# Patient Record
Sex: Female | Born: 1975 | Race: White | Hispanic: No | Marital: Married | Smoking: Never smoker
Health system: Southern US, Community
[De-identification: ages and names within clinical notes are randomized; demographics above are authoritative.]

## PROBLEM LIST (undated history)

## (undated) DIAGNOSIS — Z9889 Other specified postprocedural states: Secondary | ICD-10-CM

## (undated) DIAGNOSIS — R519 Headache, unspecified: Secondary | ICD-10-CM

## (undated) DIAGNOSIS — I10 Essential (primary) hypertension: Secondary | ICD-10-CM

## (undated) DIAGNOSIS — R112 Nausea with vomiting, unspecified: Secondary | ICD-10-CM

---

## 2000-09-05 ENCOUNTER — Other Ambulatory Visit: Admission: RE | Admit: 2000-09-05 | Discharge: 2000-09-05 | Payer: Self-pay | Admitting: Obstetrics and Gynecology

## 2002-08-28 ENCOUNTER — Encounter: Admission: RE | Admit: 2002-08-28 | Discharge: 2002-08-28 | Payer: Self-pay | Admitting: Obstetrics and Gynecology

## 2002-08-28 ENCOUNTER — Encounter: Payer: Self-pay | Admitting: Obstetrics and Gynecology

## 2004-01-08 ENCOUNTER — Other Ambulatory Visit: Admission: RE | Admit: 2004-01-08 | Discharge: 2004-01-08 | Payer: Self-pay | Admitting: Obstetrics and Gynecology

## 2006-02-23 ENCOUNTER — Encounter: Admission: RE | Admit: 2006-02-23 | Discharge: 2006-02-23 | Payer: Self-pay | Admitting: Internal Medicine

## 2006-03-14 ENCOUNTER — Ambulatory Visit: Payer: Self-pay | Admitting: Cardiology

## 2006-03-23 ENCOUNTER — Ambulatory Visit: Payer: Self-pay | Admitting: Cardiology

## 2006-03-29 ENCOUNTER — Ambulatory Visit: Payer: Self-pay | Admitting: Cardiology

## 2006-08-21 HISTORY — PX: ABLATION: SHX5711

## 2007-02-06 ENCOUNTER — Encounter: Admission: RE | Admit: 2007-02-06 | Discharge: 2007-02-06 | Payer: Self-pay | Admitting: Family Medicine

## 2008-04-21 ENCOUNTER — Other Ambulatory Visit: Admission: RE | Admit: 2008-04-21 | Discharge: 2008-04-21 | Payer: Self-pay | Admitting: Obstetrics and Gynecology

## 2009-06-21 ENCOUNTER — Other Ambulatory Visit: Admission: RE | Admit: 2009-06-21 | Discharge: 2009-06-21 | Payer: Self-pay | Admitting: Obstetrics and Gynecology

## 2009-06-22 ENCOUNTER — Encounter: Admission: RE | Admit: 2009-06-22 | Discharge: 2009-06-22 | Payer: Self-pay | Admitting: Obstetrics and Gynecology

## 2009-10-16 ENCOUNTER — Emergency Department (HOSPITAL_COMMUNITY): Admission: EM | Admit: 2009-10-16 | Discharge: 2009-10-16 | Payer: Self-pay | Admitting: Emergency Medicine

## 2010-08-16 ENCOUNTER — Other Ambulatory Visit
Admission: RE | Admit: 2010-08-16 | Discharge: 2010-08-16 | Payer: Self-pay | Source: Home / Self Care | Admitting: Obstetrics and Gynecology

## 2010-09-11 ENCOUNTER — Encounter: Payer: Self-pay | Admitting: Neurosurgery

## 2010-09-23 ENCOUNTER — Other Ambulatory Visit: Payer: Self-pay | Admitting: Obstetrics and Gynecology

## 2010-09-23 DIAGNOSIS — Z139 Encounter for screening, unspecified: Secondary | ICD-10-CM

## 2010-09-23 DIAGNOSIS — M545 Low back pain: Secondary | ICD-10-CM

## 2011-01-06 NOTE — Assessment & Plan Note (Signed)
[facility]                            [facility]   NAME:[name], [name] KLINKER                         MRN:          [id number]  DATE:03/14/2006                            DOB:          1975/10/20    REASON FOR CONSULTATION:  [name] [name] is a very pleasant 35 year old female,  with no prior cardiac history, now referred for further evaluation of  persistent hypertension.   Patient describes recent history of pregnancy induced hypertension which  developed during her pregnancy in January of this year.  She underwent  subsequent C. section in February, giving birth to a baby boy.  She denies  any postpartum complications of congestive heart failure.   Since then, she reports having had several follow-up visits for OB follow-  up, but was unaware of any persistent hypertension.   On February 20, 2006, however, she presented to [facility] ER with complaint of  bilateral hand numbness and left facial tingling.  She was found to have  elevated blood pressure (144/98) with a pulse of 110 and was referred to her  primary care physician for further management.  She was initially placed on  Lopressor for blood pressure control, but this was then switched to Lotrel  for better control.   Patient has not had any recurrent bilateral hand numbness or facial  tingling.  She does, however, have recurrent headaches which she says  preceded her recent pregnancy.   Electrocardiogram today reveals normal sinus rhythm at 97 beats per minute  with normal axis and nonspecific ST changes.   Of note, the patient does report experiencing some transient tachy  palpitations approximately one and a half weeks ago while she was cleaning  her bathtub.  She noted some associated lightheadedness, but no  presyncope/syncope.  She had no associated chest discomfort or dyspnea.   ALLERGIES:  NO KNOWN DRUG ALLERGIES.   CURRENT MEDICATION:  Lotrel 5/20 mg daily.   PAST MEDICAL  HISTORY:  Status post C. section February 2007, complicated by  pregnancy induced hypertension.   SOCIAL HISTORY:  Patient is married, has a baby boy who will be five months  tomorrow.  She works for [employer] in [place], [place].  She denies tobacco, alcohol, or illicit drug use.   FAMILY HISTORY:  Both parents alive, age 63, the father with hypertension.  There is no known history of premature coronary artery disease.   REVIEW OF SYSTEMS:  Patient denies any history of myocardial infarction,  congestive heart failure, syncope, or stroke.  Denies any exertional angina,  exertional dyspnea, PND, orthopnea or lower extremity edema.  She also  denies any tachy palpitations.  Patient also denies any prior history of  hypertension or history of hyperlipidemia.  The remaining systems negative.   RECENT LABORATORY DATA:  D-dimer 151.  Sodium 138, potassium 3.4, BUN 8 and  creatinine 0.7, all on February 20, 2006.  TSH was 1.69.  Hemoglobin/hematocrit  and platelets were normal with mild leukocytosis (12.8).  Urinalysis was  negative.   Patient also reports having had a recent MRA of  the abdomen, which was  reportedly normal.   PHYSICAL EXAMINATION:  GENERAL APPEARANCE:  A 35 year old female, mildly  obese, in no apparent distress.  VITAL SIGNS:  Blood pressure 126/86 right, 120/78 left.  Pulse 97 and  regular, weight 187.  HEENT:  Normocephalic and atraumatic.  NECK:  Palpable carotid pulses without bruits.  No JVD.  LUNGS:  Clear to auscultation in all fields.  CARDIOVASCULAR:  Regular rate and rhythm (S1 and S2), positive S4.  No  significant murmur, no rub.  ABDOMEN:  Soft, nontender with intact bowel sounds.  EXTREMITIES:  Palpable distal pulses with trace pedal edema.  NEUROLOGIC:  No focal deficit.   IMPRESSION:  1.  Pregnancy induced hypertension.      1.  Improved on Lotrel.  2.  Status post sinus tachycardia.  3.  Status post cesarean section.      1.   February 2007.   PLAN:  1.  Schedule a 2-D echocardiogram  for assessment of left ventricular      function and rule out underlying structural abnormalities.  2.  A 24-hour urine study for assessment of catecholamines/metanephrines for      work-up of pheochromocytoma.  3.  Schedule patient to return to clinic for follow-up with Dr. Willa Rough      in 3-4 weeks - of note,      consideration may be given at that time to addition of low dose beta-      blocker if she exhibits persistent high basal heart rate and/or      hypertension.                                   Gene Serpe, PA-C                                Luis Abed, MD, [facility]   GS/MedQ  DD:  03/14/2006  DT:  03/14/2006  Job #:  [id number]   cc:   Kirstie Peri, MD  Doreen Beam

## 2011-01-06 NOTE — Assessment & Plan Note (Signed)
[facility]                            [facility]   NAME:Redondo, MAGALINE STEINBERG                         MRN:          [id number]  DATE:03/29/2006                            DOB:          [birth year]    See the complete note from the patient's initial visit dated March 14, 2006.   SUBJECTIVE:  Ms. Homen has postpregnancy hypertension.  She is doing very  well.  She has been on Lotrel, and now her pressure is down to 102/60.  She  is not having any significant symptoms.  A 2-D echo was done on March 23, 2006 and shows normal LV function with no significant abnormalities.  She  has completed a 24-hour urine for catecholamine, but it is still pending.  She is feeling fine.   At this point, she is doing well.  I have asked her to stop her  antihypertensive medicine, and have her pressure checked at her place of  work.  Unless her pressure rises above 140/85, I would like to try to keep  her off the medicine and see how she does.  I have reviewed information  concerning postpregnancy hypertension.  There is not a lot of information  concerning the parameters that might suggests whether she will not will not  need ongoing medicines over time.  However, I think it is worthwhile to see  how she does off the medicine at this time.  She is stable.  I will see her  back in a month.                                   Luis Abed, MD, [facility]   JDK/MedQ  DD:  03/29/2006  DT:  03/29/2006  Job #:  [id number]   cc:   Kirstie Peri, MD

## 2011-04-05 ENCOUNTER — Other Ambulatory Visit: Payer: Self-pay | Admitting: *Deleted

## 2011-04-05 ENCOUNTER — Other Ambulatory Visit: Payer: Self-pay | Admitting: Obstetrics and Gynecology

## 2011-04-05 DIAGNOSIS — S0190XA Unspecified open wound of unspecified part of head, initial encounter: Secondary | ICD-10-CM

## 2012-03-05 ENCOUNTER — Other Ambulatory Visit: Payer: Self-pay | Admitting: Obstetrics and Gynecology

## 2012-03-05 ENCOUNTER — Other Ambulatory Visit (HOSPITAL_COMMUNITY)
Admission: RE | Admit: 2012-03-05 | Discharge: 2012-03-05 | Disposition: A | Discharge status: Home / Self Care | Payer: PRIVATE HEALTH INSURANCE | Source: Ambulatory Visit | Attending: Obstetrics and Gynecology | Admitting: Obstetrics and Gynecology

## 2012-03-05 DIAGNOSIS — Z01419 Encounter for gynecological examination (general) (routine) without abnormal findings: Secondary | ICD-10-CM | POA: Insufficient documentation

## 2013-03-26 ENCOUNTER — Other Ambulatory Visit (HOSPITAL_COMMUNITY)
Admission: RE | Admit: 2013-03-26 | Discharge: 2013-03-26 | Disposition: A | Discharge status: Home / Self Care | Payer: PRIVATE HEALTH INSURANCE | Source: Ambulatory Visit | Attending: Obstetrics and Gynecology | Admitting: Obstetrics and Gynecology

## 2013-03-26 ENCOUNTER — Other Ambulatory Visit: Payer: Self-pay | Admitting: Obstetrics and Gynecology

## 2013-03-26 DIAGNOSIS — Z01419 Encounter for gynecological examination (general) (routine) without abnormal findings: Secondary | ICD-10-CM | POA: Insufficient documentation

## 2014-05-11 ENCOUNTER — Other Ambulatory Visit: Payer: Self-pay | Admitting: Obstetrics and Gynecology

## 2014-05-11 ENCOUNTER — Other Ambulatory Visit (HOSPITAL_COMMUNITY)
Admission: RE | Admit: 2014-05-11 | Discharge: 2014-05-11 | Disposition: A | Discharge status: Home / Self Care | Payer: PRIVATE HEALTH INSURANCE | Source: Ambulatory Visit | Attending: Obstetrics and Gynecology | Admitting: Obstetrics and Gynecology

## 2014-05-11 DIAGNOSIS — Z01419 Encounter for gynecological examination (general) (routine) without abnormal findings: Secondary | ICD-10-CM | POA: Insufficient documentation

## 2014-05-13 LAB — CYTOLOGY - PAP

## 2015-11-19 ENCOUNTER — Other Ambulatory Visit: Payer: Self-pay | Admitting: Obstetrics and Gynecology

## 2015-11-19 ENCOUNTER — Other Ambulatory Visit (HOSPITAL_COMMUNITY)
Admission: RE | Admit: 2015-11-19 | Discharge: 2015-11-19 | Disposition: A | Discharge status: Home / Self Care | Payer: PRIVATE HEALTH INSURANCE | Source: Ambulatory Visit | Attending: Obstetrics and Gynecology | Admitting: Obstetrics and Gynecology

## 2015-11-19 DIAGNOSIS — Z01419 Encounter for gynecological examination (general) (routine) without abnormal findings: Secondary | ICD-10-CM | POA: Insufficient documentation

## 2015-11-19 DIAGNOSIS — Z1151 Encounter for screening for human papillomavirus (HPV): Secondary | ICD-10-CM | POA: Diagnosis present

## 2015-11-22 LAB — CYTOLOGY - PAP

## 2018-03-20 ENCOUNTER — Ambulatory Visit
Admission: RE | Admit: 2018-03-20 | Discharge: 2018-03-20 | Disposition: A | Discharge status: Home / Self Care | Payer: PRIVATE HEALTH INSURANCE | Source: Ambulatory Visit | Attending: Family Medicine | Admitting: Family Medicine

## 2018-03-20 ENCOUNTER — Other Ambulatory Visit: Payer: Self-pay | Admitting: Family Medicine

## 2018-03-20 DIAGNOSIS — M79641 Pain in right hand: Secondary | ICD-10-CM

## 2018-08-22 ENCOUNTER — Other Ambulatory Visit: Payer: Self-pay | Admitting: Obstetrics and Gynecology

## 2018-08-22 DIAGNOSIS — Z1231 Encounter for screening mammogram for malignant neoplasm of breast: Secondary | ICD-10-CM

## 2018-08-23 ENCOUNTER — Ambulatory Visit
Admission: RE | Admit: 2018-08-23 | Discharge: 2018-08-23 | Disposition: A | Discharge status: Home / Self Care | Payer: PRIVATE HEALTH INSURANCE | Source: Ambulatory Visit | Attending: Obstetrics and Gynecology | Admitting: Obstetrics and Gynecology

## 2018-08-23 DIAGNOSIS — Z1231 Encounter for screening mammogram for malignant neoplasm of breast: Secondary | ICD-10-CM

## 2018-11-27 ENCOUNTER — Encounter: Payer: Self-pay | Admitting: Podiatry

## 2018-11-27 ENCOUNTER — Ambulatory Visit: Payer: PRIVATE HEALTH INSURANCE | Admitting: Podiatry

## 2018-11-27 ENCOUNTER — Other Ambulatory Visit: Payer: Self-pay

## 2018-11-27 VITALS — BP 102/75 | HR 92 | Temp 97.5°F | Resp 16

## 2018-11-27 DIAGNOSIS — B070 Plantar wart: Secondary | ICD-10-CM

## 2018-11-27 NOTE — Progress Notes (Signed)
   Subjective:    Patient ID: [name] [name], female    DOB: Apr 20, 1976, 43 y.o.   MRN: [id number]  HPI    Review of Systems  Allergic/Immunologic: Positive for environmental allergies.  All other systems reviewed and are negative.      Objective:   Physical Exam        Assessment & Plan:

## 2018-11-27 NOTE — Progress Notes (Signed)
Subjective:   Patient ID: [name] [name], female   DOB: 43 y.o.   MRN: [id number]   HPI Patient presents with a lesion underneath the right foot that is been very sore over the last few weeks and does not remember when it may have started.  States it is been going for a period of time and that it is hard for her to bear weight down on her foot and she does not member stepping on anything.  Patient does not smoke likes to be active   Review of Systems  All other systems reviewed and are negative.       Objective:  Physical Exam Vitals signs and nursing note reviewed.  Constitutional:      Appearance: She is well-developed.  Pulmonary:     Effort: Pulmonary effort is normal.  Musculoskeletal: Normal range of motion.  Skin:    General: Skin is warm.  Neurological:     Mental Status: She is alert.     Neurovascular status intact muscle strength is adequate range of motion within normal limits with patient found to have keratotic lesion fourth interspace right that is painful when pressed and makes walking difficult.  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  I did explain procedure risk and today I did a sterile prep of the area and I am then went ahead and excised the mass exposed tissue and applied phenol to the area and sterile dressing and I did send it off for pathology evaluation.  I instructed on soaks and dressing changes and will be seen back to recheck

## 2018-11-27 NOTE — Patient Instructions (Signed)

## 2019-02-18 ENCOUNTER — Other Ambulatory Visit (HOSPITAL_COMMUNITY)
Admission: RE | Admit: 2019-02-18 | Discharge: 2019-02-18 | Disposition: A | Discharge status: Home / Self Care | Payer: PRIVATE HEALTH INSURANCE | Source: Ambulatory Visit | Attending: Obstetrics and Gynecology | Admitting: Obstetrics and Gynecology

## 2019-02-18 ENCOUNTER — Other Ambulatory Visit: Payer: Self-pay | Admitting: Obstetrics and Gynecology

## 2019-02-18 DIAGNOSIS — Z01419 Encounter for gynecological examination (general) (routine) without abnormal findings: Secondary | ICD-10-CM | POA: Diagnosis not present

## 2019-02-19 LAB — CYTOLOGY - PAP
Diagnosis: NEGATIVE
HPV: NOT DETECTED

## 2019-08-29 ENCOUNTER — Other Ambulatory Visit: Payer: Self-pay | Admitting: Obstetrics and Gynecology

## 2019-08-29 DIAGNOSIS — Z1231 Encounter for screening mammogram for malignant neoplasm of breast: Secondary | ICD-10-CM

## 2019-09-02 ENCOUNTER — Ambulatory Visit
Admission: RE | Admit: 2019-09-02 | Discharge: 2019-09-02 | Disposition: A | Discharge status: Home / Self Care | Payer: Managed Care, Other (non HMO) | Source: Ambulatory Visit | Attending: Obstetrics and Gynecology | Admitting: Obstetrics and Gynecology

## 2019-09-02 ENCOUNTER — Other Ambulatory Visit: Payer: Self-pay

## 2019-09-02 DIAGNOSIS — Z1231 Encounter for screening mammogram for malignant neoplasm of breast: Secondary | ICD-10-CM

## 2020-07-23 ENCOUNTER — Other Ambulatory Visit: Payer: Self-pay | Admitting: Physician Assistant

## 2020-07-23 DIAGNOSIS — Z1231 Encounter for screening mammogram for malignant neoplasm of breast: Secondary | ICD-10-CM

## 2020-07-28 ENCOUNTER — Ambulatory Visit: Payer: PRIVATE HEALTH INSURANCE

## 2020-08-25 ENCOUNTER — Other Ambulatory Visit (HOSPITAL_COMMUNITY): Payer: Self-pay | Admitting: Internal Medicine

## 2020-08-25 ENCOUNTER — Ambulatory Visit: Payer: PRIVATE HEALTH INSURANCE | Attending: Internal Medicine

## 2020-08-25 DIAGNOSIS — Z23 Encounter for immunization: Secondary | ICD-10-CM

## 2020-08-25 NOTE — Progress Notes (Signed)
   Covid-19 Vaccination Clinic  Name:  [name] [name]    MRN: [id number] DOB: [birth year]  08/25/2020  [name] was observed post Covid-19 immunization for 15 minutes without incident. She was provided with Vaccine Information Sheet and instruction to access the V-Safe system.   [name] was instructed to call 911 with any severe reactions post vaccine: Marland Kitchen Difficulty breathing  . Swelling of face and throat  . A fast heartbeat  . A bad rash all over body  . Dizziness and weakness   Immunizations Administered    Name Date Dose VIS Date Route   Pfizer COVID-19 Vaccine 08/25/2020 12:31 PM 0.3 mL 06/09/2020 Intramuscular   Manufacturer: [employer], [employer]   Lot: [id number]   NDC: [address]-0

## 2020-09-02 ENCOUNTER — Other Ambulatory Visit: Payer: Self-pay

## 2020-09-02 ENCOUNTER — Other Ambulatory Visit: Payer: PRIVATE HEALTH INSURANCE

## 2020-09-02 DIAGNOSIS — Z20822 Contact with and (suspected) exposure to covid-19: Secondary | ICD-10-CM

## 2020-09-05 LAB — NOVEL CORONAVIRUS, NAA: SARS-CoV-2, NAA: NOT DETECTED

## 2020-10-06 ENCOUNTER — Other Ambulatory Visit: Payer: Self-pay | Admitting: Obstetrics and Gynecology

## 2020-10-06 DIAGNOSIS — Z1231 Encounter for screening mammogram for malignant neoplasm of breast: Secondary | ICD-10-CM

## 2020-10-29 ENCOUNTER — Ambulatory Visit
Admission: RE | Admit: 2020-10-29 | Discharge: 2020-10-29 | Disposition: A | Discharge status: Home / Self Care | Payer: Managed Care, Other (non HMO) | Source: Ambulatory Visit | Attending: Obstetrics and Gynecology | Admitting: Obstetrics and Gynecology

## 2020-10-29 ENCOUNTER — Other Ambulatory Visit: Payer: Self-pay

## 2020-10-29 DIAGNOSIS — Z1231 Encounter for screening mammogram for malignant neoplasm of breast: Secondary | ICD-10-CM

## 2021-01-20 ENCOUNTER — Other Ambulatory Visit (HOSPITAL_COMMUNITY): Payer: Self-pay

## 2021-08-23 ENCOUNTER — Ambulatory Visit (INDEPENDENT_AMBULATORY_CARE_PROVIDER_SITE_OTHER): Payer: Managed Care, Other (non HMO) | Admitting: Orthopedic Surgery

## 2021-08-23 ENCOUNTER — Other Ambulatory Visit: Payer: Self-pay

## 2021-08-23 DIAGNOSIS — M79601 Pain in right arm: Secondary | ICD-10-CM

## 2021-08-24 ENCOUNTER — Encounter: Payer: Self-pay | Admitting: Orthopedic Surgery

## 2021-08-24 ENCOUNTER — Ambulatory Visit
Admission: RE | Admit: 2021-08-24 | Discharge: 2021-08-24 | Disposition: A | Discharge status: Home / Self Care | Payer: Managed Care, Other (non HMO) | Source: Ambulatory Visit | Attending: Orthopedic Surgery | Admitting: Orthopedic Surgery

## 2021-08-24 DIAGNOSIS — M79601 Pain in right arm: Secondary | ICD-10-CM

## 2021-08-24 NOTE — Progress Notes (Signed)
Office Visit Note   Patient: [name] [name]           Date of Birth: [birth year]           MRN: [id number] Visit Date: 08/23/2021 Requested by: Lennie Odor, PA 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  This has been going on for several months.  Initially it started in her neck and now she localizes pain in the deltoid and biceps region.  Reports also numbness and tingling in the right hand which is a recent phenomenon.  She has had plain radiographs of the cervical spine which does show some mild early spurring.  She has been on a prednisone course x2 with the first course helping and the second course not helping.  Hurts her to lift her arm and hand.  The pain does radiate to the volar forearm at times.  Symptoms are better when she turns her head to the left.  The arm pain does wake her from sleep.  Otherwise Alyse Low is healthy with only hypertension as a medical problem.  She does spend a lot of time working in front of the computer in her job as an Scientist, physiological.              ROS: All systems reviewed are negative as they relate to the chief complaint within the history of present illness.  Patient denies  fevers or chills.   Assessment & Plan: Visit Diagnoses:  1. Right arm pain     Plan: Impression is right arm radiculopathy with normal right shoulder exam and radiographs and cervical spine radiographs which show some spurring.  With the numbness and tingling in her hand and the long duration of symptoms with failure of conservative management I think MRI scan is indicated for possible initiation of epidural steroid injections in the cervical spine.  Follow-up after that study.  Follow-Up Instructions: Return for after MRI.   Orders:  Orders Placed This Encounter   Procedures   MR Cervical Spine w/o contrast   No orders of the defined types were placed in this encounter.     Procedures: No procedures performed   Clinical Data: No additional findings.  Objective: Vital Signs: There were no vitals taken for this visit.  Physical Exam:   Constitutional: Patient appears well-developed HEENT:  Head: Normocephalic Eyes:EOM are normal Neck: Normal range of motion Cardiovascular: Normal rate Pulmonary/chest: Effort normal Neurologic: Patient is alert Skin: Skin is warm Psychiatric: Patient has normal mood and affect   Ortho Exam: Ortho exam demonstrates good cervical spine range of motion.  5 out of 5 grip EPL FPL interosseous wrist flexion extension bicep triceps and deltoid strength.  Reflexes symmetric 0 to 1+ out of 4 bilateral biceps and triceps.  No definite paresthesias C5-T1.  No subluxation of the ulnar nerve on either elbow.  Radial pulse intact bilaterally.  Shoulder examination on the right demonstrates full active and passive range of motion with no coarse grinding or crepitus.  Rotator cuff strength intact infraspinatus supraspinatus and subscap strength testing.  Specialty Comments:  No specialty comments available.  Imaging: No results found.   PMFS History: There are no problems to display for this patient.  History reviewed. No pertinent past medical history.  History  reviewed. No pertinent family history.  History reviewed. No pertinent surgical history. Social History   Occupational History   Not on file  Tobacco Use   Smoking status: Never   Smokeless tobacco: Never  Substance and Sexual Activity   Alcohol use: Not on file   Drug use: Not on file   Sexual activity: Not on file

## 2021-08-26 ENCOUNTER — Other Ambulatory Visit: Payer: Self-pay

## 2021-08-26 DIAGNOSIS — M79601 Pain in right arm: Secondary | ICD-10-CM

## 2021-08-26 NOTE — Progress Notes (Signed)
Hi Lauren.  I called Christy with the results.  I think that the MRI scan does show a bulge at C5-6.  She still is having whole right arm symptoms.  Talk to her and she is going to get cervical ESI at [facility].  Could you please send over a request for that for right arm radiculopathy.  If that does not help her she is can call back and we will get her set up for shoulder MRI and nerve study to evaluate carpal tunnel in the hand.  Thanks

## 2021-08-29 ENCOUNTER — Ambulatory Visit
Admission: RE | Admit: 2021-08-29 | Discharge: 2021-08-29 | Disposition: A | Discharge status: Home / Self Care | Payer: Managed Care, Other (non HMO) | Source: Ambulatory Visit | Attending: Orthopedic Surgery | Admitting: Orthopedic Surgery

## 2021-08-29 DIAGNOSIS — M79601 Pain in right arm: Secondary | ICD-10-CM

## 2021-08-29 MED ORDER — TRIAMCINOLONE ACETONIDE 40 MG/ML IJ SUSP (RADIOLOGY)
60.0000 mg | Freq: Once | INTRAMUSCULAR | Status: AC
  Administered 2021-08-29: 60 mg via EPIDURAL

## 2021-08-29 MED ORDER — IOPAMIDOL (ISOVUE-M 300) INJECTION 61%
1.0000 mL | Freq: Once | INTRAMUSCULAR | Status: AC
  Administered 2021-08-29: 1 mL via EPIDURAL

## 2021-08-29 NOTE — Discharge Instructions (Signed)

## 2021-08-31 ENCOUNTER — Other Ambulatory Visit: Payer: Managed Care, Other (non HMO)

## 2021-09-19 ENCOUNTER — Other Ambulatory Visit: Payer: Self-pay

## 2021-09-19 DIAGNOSIS — M79601 Pain in right arm: Secondary | ICD-10-CM

## 2021-09-29 ENCOUNTER — Ambulatory Visit
Admission: RE | Admit: 2021-09-29 | Discharge: 2021-09-29 | Disposition: A | Discharge status: Home / Self Care | Payer: Managed Care, Other (non HMO) | Source: Ambulatory Visit | Attending: Orthopedic Surgery | Admitting: Orthopedic Surgery

## 2021-09-29 DIAGNOSIS — M79601 Pain in right arm: Secondary | ICD-10-CM

## 2021-09-29 MED ORDER — TRIAMCINOLONE ACETONIDE 40 MG/ML IJ SUSP (RADIOLOGY)
60.0000 mg | Freq: Once | INTRAMUSCULAR | Status: AC
  Administered 2021-09-29: 60 mg via EPIDURAL

## 2021-09-29 MED ORDER — IOPAMIDOL (ISOVUE-M 300) INJECTION 61%
1.0000 mL | Freq: Once | INTRAMUSCULAR | Status: AC | PRN
  Administered 2021-09-29: 1 mL via EPIDURAL

## 2021-09-29 NOTE — Discharge Instructions (Signed)

## 2021-10-21 ENCOUNTER — Other Ambulatory Visit: Payer: Self-pay | Admitting: Physician Assistant

## 2021-10-21 DIAGNOSIS — Z1231 Encounter for screening mammogram for malignant neoplasm of breast: Secondary | ICD-10-CM

## 2021-11-10 ENCOUNTER — Ambulatory Visit
Admission: RE | Admit: 2021-11-10 | Discharge: 2021-11-10 | Disposition: A | Discharge status: Home / Self Care | Payer: Managed Care, Other (non HMO) | Source: Ambulatory Visit | Attending: Physician Assistant | Admitting: Physician Assistant

## 2021-11-10 ENCOUNTER — Other Ambulatory Visit: Payer: Self-pay

## 2021-11-10 DIAGNOSIS — Z1231 Encounter for screening mammogram for malignant neoplasm of breast: Secondary | ICD-10-CM

## 2021-11-17 ENCOUNTER — Encounter: Payer: Self-pay | Admitting: Orthopedic Surgery

## 2021-11-17 DIAGNOSIS — M79601 Pain in right arm: Secondary | ICD-10-CM

## 2021-11-18 ENCOUNTER — Ambulatory Visit
Admission: RE | Admit: 2021-11-18 | Discharge: 2021-11-18 | Disposition: A | Discharge status: Home / Self Care | Payer: Managed Care, Other (non HMO) | Source: Ambulatory Visit | Attending: Orthopedic Surgery | Admitting: Orthopedic Surgery

## 2021-11-18 DIAGNOSIS — M79601 Pain in right arm: Secondary | ICD-10-CM

## 2021-11-18 MED ORDER — IOPAMIDOL (ISOVUE-M 200) INJECTION 41%
13.0000 mL | Freq: Once | INTRAMUSCULAR | Status: AC
  Administered 2021-11-18: 13 mL via INTRA_ARTICULAR

## 2021-11-18 NOTE — Discharge Instructions (Signed)

## 2021-11-22 ENCOUNTER — Telehealth: Payer: Self-pay

## 2021-11-22 NOTE — Telephone Encounter (Signed)
Please advise on MRI results for patient. ?Thanks ? ?

## 2021-11-24 NOTE — Telephone Encounter (Signed)
I called all  - her and radiologist

## 2021-11-24 NOTE — Progress Notes (Signed)
I reviewed the MRI scan.  Called another radiologist.  She has some biceps at least partial tearing and possible haggle lesion.  Plan for surgery sometime in the near future.  I called [name] also to discuss those results.

## 2021-11-30 ENCOUNTER — Telehealth: Payer: Self-pay | Admitting: Orthopedic Surgery

## 2021-11-30 NOTE — Telephone Encounter (Incomplete Revision)
Please call [name] [name] to discuss surgery.  I have patient scheduled at [facility] on May 18th at 7:30am.  She would like to have the surgery sooner, rather than later.  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   Please call her cell  [phone].   ?

## 2021-11-30 NOTE — Telephone Encounter (Addendum)
 Please call Anjeanette to discuss surgery.  I have patient scheduled at [facility] on May 18th at 7:30am.  She would like to have the surgery sooner, rather than later.  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   Please call her cell  [phone].   ?

## 2021-12-01 NOTE — Telephone Encounter (Signed)
I called [name] [name].  She really has no dislocation symptoms.  When she fell did not really have much shoulder pain at that time.  Got good relief with 2 cervical spine injections but is continuing to have biceps pain.  Definitely at least partial tearing of the biceps tendon on MRI scan so the plan at this time is arthroscopy with debridement and biceps tenodesis.  I do not think that the inferior capsular issue is real problem.  I think she will be able to answer phones 4 days after surgery as well.

## 2021-12-09 ENCOUNTER — Telehealth: Payer: Self-pay

## 2021-12-09 NOTE — Telephone Encounter (Signed)
Patient set up for surgery on 01/05/22. [facility] denied the surgery stating it wasn't "medically necessary." Their rationale letter is on your desk. I went ahead and set a peer to peer for 12/21/21 @ 2:00. ?

## 2021-12-22 NOTE — Telephone Encounter (Signed)
thx

## 2021-12-28 ENCOUNTER — Other Ambulatory Visit (HOSPITAL_COMMUNITY): Payer: Managed Care, Other (non HMO)

## 2021-12-29 NOTE — Pre-Procedure Instructions (Signed)
Surgical Instructions ? ? ? Your procedure is scheduled on Thursday, May 18th. ? Report to [facility] Main Entrance "A" at 05:30 A.M., then check in with the Admitting office. ? Call this number if you have problems the morning of surgery: ? [phone] ? ? If you have any questions prior to your surgery date call [phone]: Open Monday-Friday 8am-4pm ? ? ? Remember: ? Do not eat after midnight the night before your surgery ? ?You may drink clear liquids until 04:30 AM the morning of your surgery.   ?Clear liquids allowed are: Water, Non-Citrus Juices (without pulp), Carbonated Beverages, Clear Tea, Black Coffee Only (NO MILK, CREAM OR POWDERED CREAMER of any kind), and Gatorade. ? ? ?Patient Instructions ? ?The night before surgery:  ?No food after midnight. ONLY clear liquids after midnight ? ?The day of surgery (if you do NOT have diabetes):  ?Drink ONE (1) Pre-Surgery Clear Ensure by 04:30 AM the morning of surgery. Drink in one sitting. Do not sip.  ?This drink was given to you during your hospital  ?pre-op appointment visit. ? ?Nothing else to drink after completing the  ?Pre-Surgery Clear Ensure. ? ? ?       If you have questions, please contact your surgeon?s office.  ? ?  ? Take these medicines the morning of surgery with A SIP OF WATER  ?amLODipine (NORVASC) ? ?As of today, STOP taking any Aspirin (unless otherwise instructed by your surgeon) Aleve, Naproxen, Ibuprofen, Motrin, Advil, Goody's, BC's, all herbal medications, fish oil, and all vitamins. ?         ?           ?Do NOT Smoke (Tobacco/Vaping) for 24 hours prior to your procedure. ? ?If you use a CPAP at night, you may bring your mask/headgear for your overnight stay. ?  ?Contacts, glasses, piercing's, hearing aid's, dentures or partials may not be worn into surgery, please bring cases for these belongings.  ?  ?For patients admitted to the hospital, discharge time will be determined by your treatment team. ?  ?Patients discharged the day of  surgery will not be allowed to drive home, and someone needs to stay with them for 24 hours. ? ?SURGICAL WAITING ROOM VISITATION ?Patients having surgery or a procedure may have two support people in the waiting room. These visitors may be switched out with other visitors if needed. ?Children under the age of 58 must have an adult accompany them who is not the patient. ?If the patient needs to stay at the hospital during part of their recovery, the visitor guidelines for inpatient rooms apply. ? ?Please refer to the [facility] website for the visitor guidelines for Inpatients (after your surgery is over and you are in a regular room).  ? ? ?Special instructions:   ?[facility]- Preparing For Surgery ? ?Before surgery, you can play an important role. Because skin is not sterile, your skin needs to be as free of germs as possible. You can reduce the number of germs on your skin by washing with CHG (chlorahexidine gluconate) Soap before surgery.  CHG is an antiseptic cleaner which kills germs and bonds with the skin to continue killing germs even after washing.   ? ?Oral Hygiene is also important to reduce your risk of infection.  Remember - BRUSH YOUR TEETH THE MORNING OF SURGERY WITH YOUR REGULAR TOOTHPASTE ? ?Please do not use if you have an allergy to CHG or antibacterial soaps. If your skin becomes reddened/irritated stop using the  CHG.  ?Do not shave (including legs and underarms) for at least 48 hours prior to first CHG shower. It is OK to shave your face. ? ?Please follow these instructions carefully. ?  ?Shower the NIGHT BEFORE SURGERY and the MORNING OF SURGERY ? ?If you chose to wash your hair, wash your hair first as usual with your normal shampoo. ? ?After you shampoo, rinse your hair and body thoroughly to remove the shampoo. ? ?Use CHG Soap as you would any other liquid soap. You can apply CHG directly to the skin and wash gently with a scrungie or a clean washcloth.  ? ?Apply the CHG Soap to your body  ONLY FROM THE NECK DOWN.  Do not use on open wounds or open sores. Avoid contact with your eyes, ears, mouth and genitals (private parts). Wash Face and genitals (private parts)  with your normal soap.  ? ?Wash thoroughly, paying special attention to the area where your surgery will be performed. ? ?Thoroughly rinse your body with warm water from the neck down. ? ?DO NOT shower/wash with your normal soap after using and rinsing off the CHG Soap. ? ?Pat yourself dry with a CLEAN TOWEL. ? ?Wear CLEAN PAJAMAS to bed the night before surgery ? ?Place CLEAN SHEETS on your bed the night before your surgery ? ?DO NOT SLEEP WITH PETS. ? ? ?Day of Surgery: ?Take a shower with CHG soap. ?Do not wear jewelry or makeup ?Do not wear lotions, powders, perfumes, or deodorant. ?Do not shave 48 hours prior to surgery.   ?Do not bring valuables to the hospital.  ?[facility] is not responsible for any belongings or valuables. ?Do not wear nail polish, gel polish, artificial nails, or any other type of covering on natural nails (fingers and toes) ?If you have artificial nails or gel coating that need to be removed by a nail salon, please have this removed prior to surgery. Artificial nails or gel coating may interfere with anesthesia's ability to adequately monitor your vital signs. ?Wear Clean/Comfortable clothing the morning of surgery ?Remember to brush your teeth WITH YOUR REGULAR TOOTHPASTE. ?  ?Please read over the following fact sheets that you were given. ? ? ? ?If you received a COVID test during your pre-op visit  it is requested that you wear a mask when out in public, stay away from anyone that may not be feeling well and notify your surgeon if you develop symptoms. If you have been in contact with anyone that has tested positive in the last 10 days please notify you surgeon.  ?

## 2021-12-30 ENCOUNTER — Encounter (HOSPITAL_COMMUNITY): Payer: Self-pay

## 2021-12-30 ENCOUNTER — Encounter (HOSPITAL_COMMUNITY)
Admission: RE | Admit: 2021-12-30 | Discharge: 2021-12-30 | Disposition: A | Discharge status: Home / Self Care | Payer: Managed Care, Other (non HMO) | Source: Ambulatory Visit | Attending: Orthopedic Surgery | Admitting: Orthopedic Surgery

## 2021-12-30 ENCOUNTER — Other Ambulatory Visit: Payer: Self-pay

## 2021-12-30 VITALS — BP 121/87 | HR 98 | Temp 98.6°F | Resp 17 | Ht 62.0 in | Wt 196.2 lb

## 2021-12-30 DIAGNOSIS — Z01812 Encounter for preprocedural laboratory examination: Secondary | ICD-10-CM | POA: Insufficient documentation

## 2021-12-30 DIAGNOSIS — Z01818 Encounter for other preprocedural examination: Secondary | ICD-10-CM

## 2021-12-30 HISTORY — DX: Essential (primary) hypertension: I10

## 2021-12-30 HISTORY — DX: Headache, unspecified: R51.9

## 2021-12-30 LAB — BASIC METABOLIC PANEL
Anion gap: 8 (ref 5–15)
BUN: 7 mg/dL (ref 6–20)
CO2: 26 mmol/L (ref 22–32)
Calcium: 9.1 mg/dL (ref 8.9–10.3)
Chloride: 102 mmol/L (ref 98–111)
Creatinine, Ser: 0.64 mg/dL (ref 0.44–1.00)
GFR, Estimated: >60 mL/min (ref >60)
Glucose, Bld: 87 mg/dL (ref 70–99)
Potassium: 3.1 mmol/L — ABNORMAL LOW (ref 3.5–5.1)
Sodium: 136 mmol/L (ref 135–145)

## 2021-12-30 LAB — CBC
HCT: 45.2 % (ref 36.0–46.0)
Hemoglobin: 15 g/dL (ref 12.0–15.0)
MCH: 29.9 pg (ref 26.0–34.0)
MCHC: 33.2 g/dL (ref 30.0–36.0)
MCV: 90 fL (ref 80.0–100.0)
Platelets: 414 10*3/uL — ABNORMAL HIGH (ref 150–400)
RBC: 5.02 MIL/uL (ref 3.87–5.11)
RDW: 13 % (ref 11.5–15.5)
WBC: 10 10*3/uL (ref 4.0–10.5)
nRBC: 0 % (ref 0.0–0.2)

## 2021-12-30 NOTE — Progress Notes (Signed)
PCP - Milus Height, PA ?Cardiologist - denies ? ?PPM/ICD - denies ? ? ?Chest x-ray - denies ?EKG - pt had one at [facility] 11/09/21- records requested ?Stress Test - denies ?ECHO - denies ?Cardiac Cath - denies ? ?Sleep Study - denies ? ? ?DM- denies ? ?ASA/Blood Thinner Instructions: n/a ? ? ?ERAS Protcol - yes ?PRE-SURGERY Ensure given at PAT ? ?COVID TEST- n/a ? ? ?Anesthesia review: no ? ?Patient denies shortness of breath, fever, cough and chest pain at PAT appointment ? ? ?All instructions explained to the patient, with a verbal understanding of the material. Patient agrees to go over the instructions while at home for a better understanding. Patient also instructed to notify surgeon of any contact with COVID+ person or if she develops any symptoms. The opportunity to ask questions was provided. ?  ?

## 2022-01-05 ENCOUNTER — Other Ambulatory Visit: Payer: Self-pay

## 2022-01-05 ENCOUNTER — Ambulatory Visit (HOSPITAL_BASED_OUTPATIENT_CLINIC_OR_DEPARTMENT_OTHER): Payer: Managed Care, Other (non HMO) | Admitting: Certified Registered"

## 2022-01-05 ENCOUNTER — Encounter (HOSPITAL_COMMUNITY)
Admission: RE | Disposition: A | Discharge status: Home / Self Care | Payer: Self-pay | Source: Home / Self Care | Attending: Orthopedic Surgery

## 2022-01-05 ENCOUNTER — Ambulatory Visit (HOSPITAL_COMMUNITY): Payer: Managed Care, Other (non HMO) | Admitting: Vascular Surgery

## 2022-01-05 ENCOUNTER — Encounter (HOSPITAL_COMMUNITY): Payer: Self-pay | Admitting: Orthopedic Surgery

## 2022-01-05 ENCOUNTER — Encounter: Payer: Self-pay | Admitting: Orthopedic Surgery

## 2022-01-05 ENCOUNTER — Ambulatory Visit (HOSPITAL_COMMUNITY)
Admission: RE | Admit: 2022-01-05 | Discharge: 2022-01-05 | Disposition: A | Discharge status: Home / Self Care | Payer: Managed Care, Other (non HMO) | Attending: Orthopedic Surgery | Admitting: Orthopedic Surgery

## 2022-01-05 DIAGNOSIS — Z79899 Other long term (current) drug therapy: Secondary | ICD-10-CM | POA: Diagnosis not present

## 2022-01-05 DIAGNOSIS — W19XXXA Unspecified fall, initial encounter: Secondary | ICD-10-CM | POA: Insufficient documentation

## 2022-01-05 DIAGNOSIS — M75121 Complete rotator cuff tear or rupture of right shoulder, not specified as traumatic: Secondary | ICD-10-CM | POA: Diagnosis not present

## 2022-01-05 DIAGNOSIS — S46011A Strain of muscle(s) and tendon(s) of the rotator cuff of right shoulder, initial encounter: Secondary | ICD-10-CM | POA: Diagnosis not present

## 2022-01-05 DIAGNOSIS — M75111 Incomplete rotator cuff tear or rupture of right shoulder, not specified as traumatic: Secondary | ICD-10-CM | POA: Diagnosis not present

## 2022-01-05 DIAGNOSIS — S43431A Superior glenoid labrum lesion of right shoulder, initial encounter: Secondary | ICD-10-CM

## 2022-01-05 DIAGNOSIS — M19011 Primary osteoarthritis, right shoulder: Secondary | ICD-10-CM | POA: Diagnosis present

## 2022-01-05 DIAGNOSIS — M7521 Bicipital tendinitis, right shoulder: Secondary | ICD-10-CM | POA: Diagnosis not present

## 2022-01-05 DIAGNOSIS — S46211A Strain of muscle, fascia and tendon of other parts of biceps, right arm, initial encounter: Secondary | ICD-10-CM

## 2022-01-05 DIAGNOSIS — I10 Essential (primary) hypertension: Secondary | ICD-10-CM | POA: Diagnosis not present

## 2022-01-05 DIAGNOSIS — Z01818 Encounter for other preprocedural examination: Secondary | ICD-10-CM

## 2022-01-05 HISTORY — DX: Other specified postprocedural states: Z98.890

## 2022-01-05 HISTORY — PX: SHOULDER ARTHROSCOPY WITH CAPSULORRHAPHY: SHX6454

## 2022-01-05 HISTORY — DX: Nausea with vomiting, unspecified: R11.2

## 2022-01-05 LAB — POCT PREGNANCY, URINE: Preg Test, Ur: NEGATIVE

## 2022-01-05 SURGERY — SHOULDER ATHROSCOPY WITH CAPSULORRHAPHY
Anesthesia: Regional | Site: Shoulder | Laterality: Right

## 2022-01-05 MED ORDER — CHLORHEXIDINE GLUCONATE 0.12 % MT SOLN: 15.0000 mL | Freq: Once | OROMUCOSAL | Status: AC

## 2022-01-05 MED ORDER — PROMETHAZINE HCL 25 MG/ML IJ SOLN
INTRAMUSCULAR | Status: AC
  Administered 2022-01-05: 6.25 mg via INTRAVENOUS
  Filled 2022-01-05: qty 1

## 2022-01-05 MED ORDER — FENTANYL CITRATE (PF) 250 MCG/5ML IJ SOLN
INTRAMUSCULAR | Status: AC
Start: 2022-01-05 — End: ?
  Filled 2022-01-05: qty 5

## 2022-01-05 MED ORDER — LIDOCAINE 2% (20 MG/ML) 5 ML SYRINGE
INTRAMUSCULAR | Status: DC | PRN
  Administered 2022-01-05: 20 mg via INTRAVENOUS

## 2022-01-05 MED ORDER — SODIUM CHLORIDE 0.9 % IR SOLN
Status: DC | PRN
  Administered 2022-01-05 (×2): 3000 mL

## 2022-01-05 MED ORDER — CEFAZOLIN SODIUM-DEXTROSE 2-4 GM/100ML-% IV SOLN
2.0000 g | INTRAVENOUS | Status: AC
  Administered 2022-01-05: 2 g via INTRAVENOUS

## 2022-01-05 MED ORDER — HYDROMORPHONE HCL 1 MG/ML IJ SOLN: 0.2500 mg | INTRAMUSCULAR | Status: DC | PRN

## 2022-01-05 MED ORDER — KETOROLAC TROMETHAMINE 30 MG/ML IJ SOLN
INTRAMUSCULAR | Status: AC
  Filled 2022-01-05: qty 1

## 2022-01-05 MED ORDER — OXYCODONE-ACETAMINOPHEN 5-325 MG PO TABS
1.0000 | ORAL_TABLET | ORAL | 0 refills | Status: DC | PRN
Start: 2022-01-05 — End: 2022-01-11

## 2022-01-05 MED ORDER — POVIDONE-IODINE 7.5 % EX SOLN
Freq: Once | CUTANEOUS | Status: DC
  Filled 2022-01-05: qty 118

## 2022-01-05 MED ORDER — AMISULPRIDE (ANTIEMETIC) 5 MG/2ML IV SOLN
INTRAVENOUS | Status: AC
  Filled 2022-01-05: qty 4

## 2022-01-05 MED ORDER — PHENYLEPHRINE HCL-NACL 20-0.9 MG/250ML-% IV SOLN
INTRAVENOUS | Status: DC | PRN
  Administered 2022-01-05: 40 ug/min via INTRAVENOUS

## 2022-01-05 MED ORDER — MIDAZOLAM HCL 2 MG/2ML IJ SOLN
INTRAMUSCULAR | Status: AC
  Filled 2022-01-05: qty 2

## 2022-01-05 MED ORDER — DEXAMETHASONE SODIUM PHOSPHATE 10 MG/ML IJ SOLN
INTRAMUSCULAR | Status: AC
  Filled 2022-01-05: qty 1

## 2022-01-05 MED ORDER — CEFAZOLIN SODIUM-DEXTROSE 2-4 GM/100ML-% IV SOLN
INTRAVENOUS | Status: AC
  Filled 2022-01-05: qty 100

## 2022-01-05 MED ORDER — PROPOFOL 10 MG/ML IV BOLUS
INTRAVENOUS | Status: DC | PRN
Start: 2022-01-05 — End: 2022-01-05
  Administered 2022-01-05: 200 mg via INTRAVENOUS

## 2022-01-05 MED ORDER — METHOCARBAMOL 500 MG PO TABS: 500.0000 mg | ORAL_TABLET | Freq: Three times a day (TID) | ORAL | 1 refills | Status: AC | PRN

## 2022-01-05 MED ORDER — POVIDONE-IODINE 10 % EX SWAB
2.0000 "application " | Freq: Once | CUTANEOUS | Status: AC
  Administered 2022-01-05: 2 via TOPICAL

## 2022-01-05 MED ORDER — ROPIVACAINE HCL 5 MG/ML IJ SOLN
INTRAMUSCULAR | Status: DC | PRN
  Administered 2022-01-05: 15 mL via PERINEURAL

## 2022-01-05 MED ORDER — AMISULPRIDE (ANTIEMETIC) 5 MG/2ML IV SOLN
10.0000 mg | Freq: Once | INTRAVENOUS | Status: AC | PRN
  Administered 2022-01-05: 10 mg via INTRAVENOUS

## 2022-01-05 MED ORDER — ALBUMIN HUMAN 5 % IV SOLN: 12.5000 g | Freq: Once | INTRAVENOUS | Status: AC

## 2022-01-05 MED ORDER — ORAL CARE MOUTH RINSE: 15.0000 mL | Freq: Once | OROMUCOSAL | Status: AC

## 2022-01-05 MED ORDER — OXYCODONE HCL 5 MG/5ML PO SOLN: 5.0000 mg | Freq: Once | ORAL | Status: DC | PRN

## 2022-01-05 MED ORDER — BUPIVACAINE HCL (PF) 0.25 % IJ SOLN
INTRAMUSCULAR | Status: AC
  Filled 2022-01-05: qty 30

## 2022-01-05 MED ORDER — OXYCODONE HCL 5 MG PO TABS: 5.0000 mg | ORAL_TABLET | Freq: Once | ORAL | Status: DC | PRN

## 2022-01-05 MED ORDER — SUGAMMADEX SODIUM 200 MG/2ML IV SOLN
INTRAVENOUS | Status: DC | PRN
  Administered 2022-01-05: 200 mg via INTRAVENOUS

## 2022-01-05 MED ORDER — KETOROLAC TROMETHAMINE 30 MG/ML IJ SOLN
30.0000 mg | Freq: Once | INTRAMUSCULAR | Status: AC | PRN
  Administered 2022-01-05: 30 mg via INTRAVENOUS

## 2022-01-05 MED ORDER — PROPOFOL 10 MG/ML IV BOLUS
INTRAVENOUS | Status: AC
  Filled 2022-01-05: qty 20

## 2022-01-05 MED ORDER — DEXAMETHASONE SODIUM PHOSPHATE 10 MG/ML IJ SOLN
INTRAMUSCULAR | Status: DC | PRN
  Administered 2022-01-05: 4 mg via INTRAVENOUS

## 2022-01-05 MED ORDER — SCOPOLAMINE 1 MG/3DAYS TD PT72
MEDICATED_PATCH | TRANSDERMAL | Status: AC
  Administered 2022-01-05: 1.5 mg via TRANSDERMAL
  Filled 2022-01-05: qty 1

## 2022-01-05 MED ORDER — LIDOCAINE 2% (20 MG/ML) 5 ML SYRINGE
INTRAMUSCULAR | Status: AC
  Filled 2022-01-05: qty 5

## 2022-01-05 MED ORDER — ONDANSETRON HCL 4 MG/2ML IJ SOLN
INTRAMUSCULAR | Status: AC
  Filled 2022-01-05: qty 2

## 2022-01-05 MED ORDER — EPINEPHRINE PF 1 MG/ML IJ SOLN
INTRAMUSCULAR | Status: DC | PRN
  Administered 2022-01-05 (×2): 1 mg

## 2022-01-05 MED ORDER — EPINEPHRINE PF 1 MG/ML IJ SOLN
INTRAMUSCULAR | Status: AC
  Filled 2022-01-05: qty 4

## 2022-01-05 MED ORDER — ONDANSETRON HCL 4 MG/2ML IJ SOLN: 4.0000 mg | Freq: Once | INTRAMUSCULAR | Status: DC | PRN

## 2022-01-05 MED ORDER — FENTANYL CITRATE (PF) 250 MCG/5ML IJ SOLN
INTRAMUSCULAR | Status: DC | PRN
  Administered 2022-01-05 (×3): 50 ug via INTRAVENOUS
  Administered 2022-01-05: 100 ug via INTRAVENOUS

## 2022-01-05 MED ORDER — LACTATED RINGERS IV SOLN: INTRAVENOUS | Status: DC

## 2022-01-05 MED ORDER — ALBUMIN HUMAN 5 % IV SOLN
12.5000 g | Freq: Once | INTRAVENOUS | Status: AC
Start: 2022-01-05 — End: 2022-01-05

## 2022-01-05 MED ORDER — CHLORHEXIDINE GLUCONATE 0.12 % MT SOLN
OROMUCOSAL | Status: AC
  Administered 2022-01-05: 15 mL via OROMUCOSAL
  Filled 2022-01-05: qty 15

## 2022-01-05 MED ORDER — SCOPOLAMINE 1 MG/3DAYS TD PT72: 1.0000 | MEDICATED_PATCH | TRANSDERMAL | Status: DC

## 2022-01-05 MED ORDER — CELECOXIB 100 MG PO CAPS: 100.0000 mg | ORAL_CAPSULE | Freq: Two times a day (BID) | ORAL | 0 refills | Status: AC

## 2022-01-05 MED ORDER — MIDAZOLAM HCL 2 MG/2ML IJ SOLN
INTRAMUSCULAR | Status: DC | PRN
Start: 2022-01-05 — End: 2022-01-05
  Administered 2022-01-05: 2 mg via INTRAVENOUS

## 2022-01-05 MED ORDER — ROCURONIUM BROMIDE 10 MG/ML (PF) SYRINGE
PREFILLED_SYRINGE | INTRAVENOUS | Status: AC
  Filled 2022-01-05: qty 10

## 2022-01-05 MED ORDER — ALBUMIN HUMAN 5 % IV SOLN
INTRAVENOUS | Status: AC
  Administered 2022-01-05: 12.5 g via INTRAVENOUS
  Filled 2022-01-05: qty 250

## 2022-01-05 MED ORDER — BUPIVACAINE LIPOSOME 1.3 % IJ SUSP
INTRAMUSCULAR | Status: DC | PRN
  Administered 2022-01-05: 10 mL via PERINEURAL

## 2022-01-05 MED ORDER — PHENYLEPHRINE 80 MCG/ML (10ML) SYRINGE FOR IV PUSH (FOR BLOOD PRESSURE SUPPORT)
PREFILLED_SYRINGE | INTRAVENOUS | Status: AC
  Filled 2022-01-05: qty 10

## 2022-01-05 MED ORDER — PROMETHAZINE HCL 25 MG/ML IJ SOLN: 6.2500 mg | INTRAMUSCULAR | Status: DC | PRN

## 2022-01-05 MED ORDER — ROCURONIUM BROMIDE 10 MG/ML (PF) SYRINGE
PREFILLED_SYRINGE | INTRAVENOUS | Status: DC | PRN
  Administered 2022-01-05: 60 mg via INTRAVENOUS

## 2022-01-05 MED ORDER — ONDANSETRON HCL 4 MG/2ML IJ SOLN
INTRAMUSCULAR | Status: DC | PRN
Start: 2022-01-05 — End: 2022-01-05
  Administered 2022-01-05: 4 mg via INTRAVENOUS

## 2022-01-05 SURGICAL SUPPLY — 82 items
AID PSTN UNV HD RSTRNT DISP (MISCELLANEOUS) ×1
ANCH SUT 2 FBRTK KNTLS 1.8 (Anchor) ×1 IMPLANT
ANCH SUT 2 SWLK 19.1 CLS EYLT (Anchor) ×2 IMPLANT
ANCH SUT FBRTK 1.3 2 TPE (Anchor) ×1 IMPLANT
ANCHOR FBRTK 2.6 SUTURETAP 1.3 (Anchor) ×1 IMPLANT
ANCHOR SUT 1.8 FIBERTAK SB KL (Anchor) ×1 IMPLANT
ANCHOR SWIVELOCK BIO 4.75X19.1 (Anchor) ×2 IMPLANT
BAG COUNTER SPONGE SURGICOUNT (BAG) ×2 IMPLANT
BAG SPNG CNTER NS LX DISP (BAG) ×1
BLADE EXCALIBUR 4.0X13 (MISCELLANEOUS) IMPLANT
BLADE SHAVER TORPEDO 4X13 (MISCELLANEOUS) ×2 IMPLANT
BLADE SURG 11 STRL SS (BLADE) ×2 IMPLANT
BUR OVAL 6.0 (BURR) IMPLANT
CANNULA 5.75X7 CRYSTAL CLEAR (CANNULA) ×1 IMPLANT
COOLER ICEMAN CLASSIC (MISCELLANEOUS) ×1 IMPLANT
COVER SURGICAL LIGHT HANDLE (MISCELLANEOUS) ×2 IMPLANT
DRAPE INCISE IOBAN 66X45 STRL (DRAPES) ×4 IMPLANT
DRAPE STERI 35X30 U-POUCH (DRAPES) ×2 IMPLANT
DRAPE U-SHAPE 47X51 STRL (DRAPES) ×4 IMPLANT
DRSG IV TEGADERM 3.5X4.5 STRL (GAUZE/BANDAGES/DRESSINGS) ×1 IMPLANT
DRSG PAD ABDOMINAL 8X10 ST (GAUZE/BANDAGES/DRESSINGS) ×6 IMPLANT
DRSG TEGADERM 4X4.5 CHG (GAUZE/BANDAGES/DRESSINGS) ×2 IMPLANT
DRSG TEGADERM 4X4.75 (GAUZE/BANDAGES/DRESSINGS) ×8 IMPLANT
DRSG XEROFORM 1X8 (GAUZE/BANDAGES/DRESSINGS) ×1 IMPLANT
DURAPREP 26ML APPLICATOR (WOUND CARE) ×2 IMPLANT
ELECT REM PT RETURN 9FT ADLT (ELECTROSURGICAL) ×2
ELECTRODE REM PT RTRN 9FT ADLT (ELECTROSURGICAL) ×1 IMPLANT
FILTER STRAW FLUID ASPIR (MISCELLANEOUS) ×2 IMPLANT
GAUZE SPONGE 4X4 12PLY STRL (GAUZE/BANDAGES/DRESSINGS) ×1 IMPLANT
GAUZE SPONGE 4X4 12PLY STRL LF (GAUZE/BANDAGES/DRESSINGS) ×2 IMPLANT
GAUZE XEROFORM 1X8 LF (GAUZE/BANDAGES/DRESSINGS) ×2 IMPLANT
GLOVE BIOGEL PI IND STRL 8 (GLOVE) ×1 IMPLANT
GLOVE BIOGEL PI INDICATOR 8 (GLOVE) ×1
GLOVE ECLIPSE 8.0 STRL XLNG CF (GLOVE) ×2 IMPLANT
GOWN STRL REUS W/ TWL LRG LVL3 (GOWN DISPOSABLE) ×3 IMPLANT
GOWN STRL REUS W/TWL LRG LVL3 (GOWN DISPOSABLE) ×6
KIT BASIN OR (CUSTOM PROCEDURE TRAY) ×2 IMPLANT
KIT TURNOVER KIT B (KITS) ×2 IMPLANT
MANIFOLD NEPTUNE II (INSTRUMENTS) ×2 IMPLANT
NDL HYPO 25X1 1.5 SAFETY (NEEDLE) ×1 IMPLANT
NDL SCORPION MULTI FIRE (NEEDLE) IMPLANT
NDL SPNL 18GX3.5 QUINCKE PK (NEEDLE) ×1 IMPLANT
NDL SUT 6 .5 CRC .975X.05 MAYO (NEEDLE) IMPLANT
NEEDLE HYPO 25X1 1.5 SAFETY (NEEDLE) ×2 IMPLANT
NEEDLE MAYO TAPER (NEEDLE)
NEEDLE SCORPION MULTI FIRE (NEEDLE) ×2 IMPLANT
NEEDLE SPNL 18GX3.5 QUINCKE PK (NEEDLE) ×2 IMPLANT
NS IRRIG 1000ML POUR BTL (IV SOLUTION) ×2 IMPLANT
PACK SHOULDER (CUSTOM PROCEDURE TRAY) ×2 IMPLANT
PAD ARMBOARD 7.5X6 YLW CONV (MISCELLANEOUS) ×4 IMPLANT
PAD COLD SHLDR WRAP-ON (PAD) ×1 IMPLANT
PORT APPOLLO RF 90DEGREE MULTI (SURGICAL WAND) IMPLANT
PROBE APOLLO 90XL (SURGICAL WAND) ×1 IMPLANT
RESTRAINT HEAD UNIVERSAL NS (MISCELLANEOUS) ×2 IMPLANT
SLING ARM IMMOBILIZER LRG (SOFTGOODS) ×1 IMPLANT
SLING ARM IMMOBILIZER MED (SOFTGOODS) IMPLANT
SPONGE T-LAP 4X18 ~~LOC~~+RFID (SPONGE) ×4 IMPLANT
STRIP CLOSURE SKIN 1/2X4 (GAUZE/BANDAGES/DRESSINGS) ×2 IMPLANT
SUCTION FRAZIER HANDLE 10FR (MISCELLANEOUS) ×2
SUCTION TUBE FRAZIER 10FR DISP (MISCELLANEOUS) ×1 IMPLANT
SUT ETHILON 3 0 PS 1 (SUTURE) ×2 IMPLANT
SUT FIBERWIRE #2 38 T-5 BLUE (SUTURE)
SUT FIBERWIRE 2-0 18 17.9 3/8 (SUTURE) ×4
SUT MNCRL AB 3-0 PS2 18 (SUTURE) ×2 IMPLANT
SUT VIC AB 0 CT1 27 (SUTURE) ×2
SUT VIC AB 0 CT1 27XBRD ANBCTR (SUTURE) ×1 IMPLANT
SUT VIC AB 1 CT1 27 (SUTURE) ×2
SUT VIC AB 1 CT1 27XBRD ANBCTR (SUTURE) ×1 IMPLANT
SUT VIC AB 2-0 CT1 27 (SUTURE) ×2
SUT VIC AB 2-0 CT1 TAPERPNT 27 (SUTURE) ×1 IMPLANT
SUT VICRYL 0 UR6 27IN ABS (SUTURE) IMPLANT
SUTURE FIBERWR #2 38 T-5 BLUE (SUTURE) IMPLANT
SUTURE FIBERWR 2-0 18 17.9 3/8 (SUTURE) IMPLANT
SYR 20ML LL LF (SYRINGE) ×4 IMPLANT
SYR 3ML LL SCALE MARK (SYRINGE) ×2 IMPLANT
SYR TB 1ML LUER SLIP (SYRINGE) ×2 IMPLANT
SYS FBRTK BUTTON 2.6 (Anchor) ×2 IMPLANT
SYSTEM FBRTK BUTTON 2.6 (Anchor) IMPLANT
TOWEL GREEN STERILE (TOWEL DISPOSABLE) ×2 IMPLANT
TOWEL GREEN STERILE FF (TOWEL DISPOSABLE) ×2 IMPLANT
TUBING ARTHROSCOPY IRRIG 16FT (MISCELLANEOUS) ×2 IMPLANT
WATER STERILE IRR 1000ML POUR (IV SOLUTION) ×2 IMPLANT

## 2022-01-05 NOTE — Anesthesia Preprocedure Evaluation (Addendum)
Anesthesia Evaluation  Patient identified by MRN, date of birth, ID band Patient awake    Reviewed: Allergy & Precautions, NPO status , Patient's Chart, lab work & pertinent test results  Airway Mallampati: III  TM Distance: >3 FB Neck ROM: Full    Dental  (+) Teeth Intact, Dental Advisory Given   Pulmonary neg pulmonary ROS,    Pulmonary exam normal breath sounds clear to auscultation       Cardiovascular hypertension, Pt. on medications Normal cardiovascular exam Rhythm:Regular Rate:Normal     Neuro/Psych  Headaches, negative psych ROS   GI/Hepatic negative GI ROS, Neg liver ROS,   Endo/Other  BMI 35  Renal/GU negative Renal ROS  negative genitourinary   Musculoskeletal negative musculoskeletal ROS (+)   Abdominal   Peds  Hematology negative hematology ROS (+)   Anesthesia Other Findings   Reproductive/Obstetrics negative OB ROS                            Anesthesia Physical Anesthesia Plan  ASA: 2  Anesthesia Plan: General and Regional   Post-op Pain Management: Regional block*   Induction: Intravenous  PONV Risk Score and Plan: 3 and Ondansetron, Dexamethasone, Midazolam and Treatment may vary due to age or medical condition  Airway Management Planned: Oral ETT  Additional Equipment: None  Intra-op Plan:   Post-operative Plan: Extubation in OR  Informed Consent: I have reviewed the patients History and Physical, chart, labs and discussed the procedure including the risks, benefits and alternatives for the proposed anesthesia with the patient or authorized representative who has indicated his/her understanding and acceptance.     Dental advisory given  Plan Discussed with: CRNA  Anesthesia Plan Comments:         Anesthesia Quick Evaluation

## 2022-01-05 NOTE — Transfer of Care (Signed)
Immediate Anesthesia Transfer of Care Note  Patient: [name] [name]  Procedure(s) Performed: right shoulder arthroscopy, biceps tenodesis, open capsular repair (Right: Shoulder)  Patient Location: PACU  Anesthesia Type:General and Regional  Level of Consciousness: drowsy, patient cooperative and responds to stimulation  Airway & Oxygen Therapy: Patient Spontanous Breathing and Patient connected to nasal cannula oxygen  Post-op Assessment: Report given to RN and Post -op Vital signs reviewed and stable  Post vital signs: Reviewed and stable  Last Vitals:  Vitals Value Taken Time  BP    Temp    Pulse 90 01/05/22 1042  Resp    SpO2 92 % 01/05/22 1042  Vitals shown include unvalidated device data.  Last Pain:  Vitals:   01/05/22 0549  TempSrc:   PainSc: 0-No pain         Complications: No notable events documented.

## 2022-01-05 NOTE — Op Note (Signed)
NAME: [name] [name], ZAKRZEWSKI MEDICAL RECORD NO: [id number] ACCOUNT NO: [id number] DATE OF BIRTH: May 25, 1976 FACILITY: [facility] LOCATION: [facility] PHYSICIAN: Graylin Shiver. August Saucer, MD  Operative Report   DATE OF PROCEDURE: 01/05/2022  PREOPERATIVE DIAGNOSIS:  Right shoulder SLAP tear with possible inferior capsular disruption.  POSTOPERATIVE DIAGNOSES:  Right shoulder degenerative superior labral degeneration and tearing with 90% thickness bursal-sided rotator cuff tear of the supraspinatus with underlying tuberosity spurring.  PROCEDURES:  Right shoulder arthroscopy with superior labral debridement, biceps tendon release, mini open biceps tenodesis and mini open repair, rotator cuff tear, supraspinatus bursal-sided 90% thickness tear measuring approximately 1.5 x 1.5 cm.  SURGEON:  Graylin Shiver. August Saucer, MD  ASSISTANT:  Karenann Cai, PA.  INDICATIONS:  The patient is a 46 year old patient with right shoulder pain of long duration.  Describes mechanical symptoms in the anterolateral aspect of the shoulder.  MRI scan did not show full thickness rotator cuff tear, but did show some labral  degeneration and biceps tendon partial tearing.  Also, questionable inferior capsular tearing, but there was no instability by history.  She presents now for operative management after failure of conservative management.  DESCRIPTION OF PROCEDURE:  The patient was brought to the operating room where general anesthetic was induced.  Preoperative antibiotics administered.  Timeout was called.  The patient was placed in the beach chair position with the head in neutral  position.  Both shoulders were examined preoperatively under anesthesia and she was found to have one and half posterior instability and 1+ anterior instability with less than a centimeter sulcus sign bilaterally.  The posterior laxity was definitely  more than the anterior laxity.  After positioning the right arm, shoulder and hand was pre-scrubbed with alcohol and  Betadine, allowed to air dry, prepped with DuraPrep solution and draped in sterile manner.  Collier Flowers was used to seal the operative field.   Timeout was called.  Posterior portal was created 2 cm medial and inferior to the posterolateral margin of acromion.  Anterior portal created under direct visualization.  The patient did have some degeneration around the biceps anchor.  Also, some  fraying of the labral tissue anteriorly.  This was debrided.  Biceps tendon did have also partial tearing when we pulled it into the joint.  Biceps tendon was released and we preserved the anterior labrum and superior labrum where possible.  Glenohumeral  articular surfaces were intact in particular, no Hill-Sachs or Bankart lesions were present.  Anterior inferior, posterior inferior glenohumeral ligaments were intact.  There was some thinning of the capsule inferiorly, but no discrete disruption.  The  rotator cuff also appeared intact from the articular side.  At this time, instruments were removed.  Portals were closed using 3-0 nylon.  The Collier Flowers was then used to cover the entire operative field.  Incision made off the anterolateral margin of the  acromion, raphae between the anterior middle deltoid was split and measured distance of 4 cm and marked with #1 Vicryl suture.  Bursectomy performed. The patient did have a near full-thickness bursal-sided tear of the supraspinatus, which was more  degenerative in nature.  Rasping of the anterolateral acromion was performed.  Biceps tendon was tenodesed into the bicipital groove using a knotless Arthrex SutureTak's 1.9 distally and 3.2 proximally.  Good fixation under appropriate tension was  achieved.  Next, attention was directed towards the rotator cuff tear.  The tear edges were debrided.  Using a 15 blade, we essentially got back to normal-appearing tissue, making the tear around  approximately 1 x 1 cm.  Once the degenerated bursal-sided  tendinous tissue was debrided using a  15 blade, a knotless suture anchor with 4 tapes was placed.  These 4 tapes were then placed through the tendon using a Scorpion suture passer.  Then, the margin convergence was performed with 2 inverted 2-0  FiberWire sutures, 1-0 Vicryl suture placed in the residual lateral tendinous tissue.  At this time, the suture tapes were tied and crossed.  They were then matched with the Vicryl suture, and FiberWire suture anteriorly and posteriorly.  These sutures  were then placed in SwiveLock for watertight repair achieved.  Arm was taken through range of motion, found to have no popping or clicking.  At this time, thorough irrigation was performed.  Deltoid split closed using #1 Vicryl suture followed by  interrupted inverted 0 Vicryl suture, 2-0 Vicryl suture, and 3-0 Monocryl with Steri-Strips and impervious dressings applied.  The patient tolerated the procedure well without immediate complications.  Luke's assistance was required at all times for  retraction, opening, closing, mobilization of tissue, suture management during the mini open rotator cuff tear repair and biceps tenodesis as well as drilling.  His assistance was a medical necessity.   PUS D: 01/05/2022 11:02:54 am T: 01/05/2022 1:40:00 pm  JOB: [id number]/ [id number]

## 2022-01-05 NOTE — Anesthesia Procedure Notes (Signed)
Anesthesia Regional Block: Interscalene brachial plexus block   Pre-Anesthetic Checklist: , timeout performed,  Correct Patient, Correct Site, Correct Laterality,  Correct Procedure, Correct Position, site marked,  Risks and benefits discussed,  Surgical consent,  Pre-op evaluation,  At surgeon's request and post-op pain management  Laterality: Right  Prep: Maximum Sterile Barrier Precautions used, chloraprep       Needles:  Injection technique: Single-shot  Needle Type: Echogenic Stimulator Needle     Needle Length: 9cm  Needle Gauge: 22     Additional Needles:   Procedures:,,,, ultrasound used (permanent image in chart),,    Narrative:  Start time: 01/05/2022 7:00 AM End time: 01/05/2022 7:10 AM Injection made incrementally with aspirations every 5 mL.  Performed by: Personally  Anesthesiologist: Pervis Hocking, DO  Additional Notes: Monitors applied. No increased pain on injection. No increased resistance to injection. Injection made in 5cc increments. Good needle visualization. Patient tolerated procedure well.

## 2022-01-05 NOTE — Brief Op Note (Signed)
   01/05/2022  10:55 AM  PATIENT:  [name] [name]  46 y.o. female  PRE-OPERATIVE DIAGNOSIS:  right shoulder biceps tear,capsular tear  POST-OPERATIVE DIAGNOSIS:  right shoulder biceps tear, near full-thickness bursal rotator cuff tear supraspinatus r  PROCEDURE:  Procedure(s): right shoulder arthroscopy, biceps tenodesis, mini open rotator cuff tear repair  SURGEON:  Surgeon(s): Marlou Sa, Tonna Corner, MD  ASSISTANT: Annie Main, PA  ANESTHESIA:   general  EBL: 15 ml    Total I/O In: 750 [I.V.:750] Out: 50 [Blood:50]  BLOOD ADMINISTERED: none  DRAINS: none   LOCAL MEDICATIONS USED:  none  SPECIMEN:  No Specimen  COUNTS:  YES  TOURNIQUET:  * No tourniquets in log *  DICTATION: .[id number] PLAN OF CARE: Discharge to home after PACU  PATIENT DISPOSITION:  PACU - hemodynamically stable  DIAGNOSES: Right shoulder, chronic rotator cuff tear and biceps tendinitis.  POST-OPERATIVE DIAGNOSIS: same  PROCEDURE: Open repair chronic rotator cuff tear - [id number] Open subpectoral biceps tenodesis - [id number] Arthroscopic limited debridement [id number]   OPERATIVE FINDING: Exam under anesthesia:  1-1/2+ posterior instability bilaterally with 1+ anterior instability and less than a centimeter sulcus sign bilaterally Articular space: Normal Chondral surfaces: Normal Biceps: degenerative SLAP tear Subscapularis: Intact  Supraspinatus: Incomplete tear 1 x 1 cm bursal sided Infraspinatus: Intact

## 2022-01-05 NOTE — H&P (Signed)
[name] [name] is an 46 y.o. female.   Chief Complaint: Right shoulder pain HPI: Patient presents with right shoulder pain of at least 6 months duration.  Describes having a fall about 6 months ago.  Developed shoulder and arm pain which initially appeared to be more radicular in nature.  Patient tried Medrol Dosepak x2 but had persistent symptoms radiating into the volar aspect of her arm.  MRI scanning in January of the neck demonstrated some loss of lordosis and mild degenerative changes but no discrete herniated disc.  She did have an injection in her neck which helped with the radicular component of her arm pain but the shoulder symptoms persisted.  Subsequent MRI scan several months later demonstrated partial tearing of the biceps with possible degenerative tearing of the superior labrum with intact rotator cuff.  She does report more mechanical symptoms in the shoulder localizing prominently anteriorly.  She has failed conservative management with the shoulder.  Presents now for operative evaluation and arthroscopy with possible biceps tenodesis and superior labral debridement.  Past Medical History:  Diagnosis Date   Headache    pt has migraines (she states maybe about 10 times a month)   Hypertension     Past Surgical History:  Procedure Laterality Date   ABLATION  2008   endometrial   CESAREAN SECTION  2007    Family History  Problem Relation Age of Onset   Breast cancer Neg Hx    Social History:  reports that she has never smoked. She has never used smokeless tobacco. She reports that she does not currently use alcohol. She reports that she does not use drugs.  Allergies: No Known Allergies  Medications Prior to Admission  Medication Sig Dispense Refill   amLODipine (NORVASC) 5 MG tablet Take 5 mg by mouth daily.     hydrochlorothiazide (HYDRODIURIL) 25 MG tablet Take 12.5 mg by mouth daily.     Multiple Vitamins-Minerals (MULTIVITAMIN WITH MINERALS) tablet Take 1 tablet by  mouth daily.     valsartan (DIOVAN) 160 MG tablet Take 160 mg by mouth daily.      Results for orders placed or performed during the hospital encounter of 01/05/22 (from the past 48 hour(s))  Pregnancy, urine POC     Status: None   Collection Time: 01/05/22  5:57 AM  Result Value Ref Range   Preg Test, Ur NEGATIVE NEGATIVE    Comment:        THE SENSITIVITY OF THIS METHODOLOGY IS >24 mIU/mL    No results found.  Review of Systems  Musculoskeletal:  Positive for arthralgias.  All other systems reviewed and are negative.  Blood pressure 133/81, pulse 97, temperature 97.8 F (36.6 C), temperature source Oral, resp. rate 18, height 5\' 2"  (1.575 m), weight 86.2 kg, SpO2 100 %. Physical Exam Vitals reviewed.  HENT:     Head: Normocephalic.     Nose: Nose normal.  Eyes:     Pupils: Pupils are equal, round, and reactive to light.  Cardiovascular:     Rate and Rhythm: Normal rate.     Pulses: Normal pulses.  Pulmonary:     Effort: Pulmonary effort is normal.  Abdominal:     General: Abdomen is flat.  Musculoskeletal:     Cervical back: Normal range of motion.  Skin:    General: Skin is warm.     Capillary Refill: Capillary refill takes less than 2 seconds.  Neurological:     General: No focal deficit present.  Mental Status: She is alert.  Ortho exam demonstrates good range of motion of the neck and right shoulder.  O'Brien's testing equivocal on the right negative on the left.  Negative apprehension relocation testing on the right and left-hand side.  No discrete AC joint tenderness is present.  Patient does localize pain in the anterior aspect of the shoulder.  No restriction of external rotation asymmetrically right versus left.  Rotator cuff strength intact infraspinatus supraspinatus and subscap muscle testing.  Assessment/Plan Impression is right shoulder pain following a fall earlier this year.  Initially patient describes symptoms consistent with possible  radiculopathy.  Cervical spine MRI was obtained which showed no significant foraminal narrowing.  The radicular component of her symptoms improved with epidural steroid injection in the neck; however her shoulder symptoms persisted and have become more mechanical in nature.  MRI scanning of the shoulder does demonstrate at least partial tearing of the biceps tendon.  Degenerative SLAP tear may also be present which is consistent with her mechanism of injury with the fall approximately 6 months ago.  With failure of conservative treatment the plan at this time is shoulder arthroscopy with evaluation of the biceps tendon and biceps anchor.  We will also look at the inferior capsule which had an abnormality on MRI scanning which may or may not be related to contrast extravasation.  She does not really report any instability symptoms but more mechanical symptoms likely related to the biceps tendon.  The risk and benefits of the procedure are discussed including but not limited to infection nerve vessel damage shoulder stiffness as well as incomplete pain relief.  Patient understands risk and benefits and wishes to proceed.  All questions answered.  Burnard Bunting, MD 01/05/2022, 6:57 AM

## 2022-01-05 NOTE — Anesthesia Postprocedure Evaluation (Signed)
Anesthesia Post Note  Patient: [name] [name]  Procedure(s) Performed: right shoulder arthroscopy, biceps tenodesis, open capsular repair (Right: Shoulder)     Patient location during evaluation: PACU Anesthesia Type: Regional and General Level of consciousness: awake and alert, oriented and patient cooperative Pain management: pain level controlled Vital Signs Assessment: post-procedure vital signs reviewed and stable Respiratory status: spontaneous breathing, nonlabored ventilation and respiratory function stable Cardiovascular status: blood pressure returned to baseline and stable Postop Assessment: no apparent nausea or vomiting Anesthetic complications: yes Comments: PONV in PACU resistant to barhemysis   No notable events documented.  Last Vitals:  Vitals:   01/05/22 1200 01/05/22 1215  BP: (!) 84/48 (!) 90/49  Pulse: 95 93  Resp: 13 16  Temp:    SpO2: 94% 94%    Last Pain:  Vitals:   01/05/22 1115  TempSrc:   PainSc: 0-No pain                 Lannie Fields

## 2022-01-05 NOTE — Anesthesia Procedure Notes (Signed)
Procedure Name: Intubation Date/Time: 01/05/2022 7:35 AM Performed by: Cathren Harsh, CRNA Pre-anesthesia Checklist: Patient identified, Emergency Drugs available, Suction available and Patient being monitored Patient Re-evaluated:Patient Re-evaluated prior to induction Oxygen Delivery Method: Circle System Utilized Preoxygenation: Pre-oxygenation with 100% oxygen Induction Type: IV induction Ventilation: Mask ventilation without difficulty Laryngoscope Size: Mac and 3 Grade View: Grade II Tube type: Oral Tube size: 7.0 mm Number of attempts: 1 Airway Equipment and Method: Stylet and Oral airway Placement Confirmation: ETT inserted through vocal cords under direct vision, positive ETCO2 and breath sounds checked- equal and bilateral Secured at: 21 cm Tube secured with: Tape Dental Injury: Teeth and Oropharynx as per pre-operative assessment

## 2022-01-06 ENCOUNTER — Other Ambulatory Visit: Payer: Self-pay | Admitting: Surgical

## 2022-01-06 ENCOUNTER — Encounter (HOSPITAL_COMMUNITY): Payer: Self-pay | Admitting: Orthopedic Surgery

## 2022-01-06 MED ORDER — ONDANSETRON HCL 4 MG PO TABS: 4.0000 mg | ORAL_TABLET | Freq: Three times a day (TID) | ORAL | 0 refills | Status: AC | PRN

## 2022-01-07 ENCOUNTER — Telehealth: Payer: Self-pay | Admitting: Orthopedic Surgery

## 2022-01-07 NOTE — Telephone Encounter (Signed)
Just talk with [name] [name].  In general she is feeling better than she did immediately after surgery.  She is doing some pendulum exercises and will get the CPM machine on Monday.

## 2022-01-11 ENCOUNTER — Other Ambulatory Visit: Payer: Self-pay | Admitting: Orthopedic Surgery

## 2022-01-11 MED ORDER — OXYCODONE HCL 5 MG PO TABS: 5.0000 mg | ORAL_TABLET | ORAL | 0 refills | Status: DC | PRN

## 2022-01-12 ENCOUNTER — Ambulatory Visit (INDEPENDENT_AMBULATORY_CARE_PROVIDER_SITE_OTHER): Payer: Managed Care, Other (non HMO) | Admitting: Orthopedic Surgery

## 2022-01-12 ENCOUNTER — Encounter: Payer: Self-pay | Admitting: Orthopedic Surgery

## 2022-01-12 DIAGNOSIS — M75111 Incomplete rotator cuff tear or rupture of right shoulder, not specified as traumatic: Secondary | ICD-10-CM

## 2022-01-12 NOTE — Progress Notes (Signed)
   Post-Op Visit Note   Patient: [name] [name]           Date of Birth: 12-07-1975           MRN: [id number] Visit Date: 01/12/2022 PCP: Milus Height, PA   Assessment & Plan:  Chief Complaint:  Chief Complaint  Patient presents with   Right Shoulder - Routine Post Op   Visit Diagnoses:  1. Incomplete tear of right rotator cuff, unspecified whether traumatic     Plan: [name] [name] is a 46 year old patient who underwent right shoulder arthroscopy with biceps tenodesis and mini open rotator cuff tear repair 01/05/2022.  Overall she is making gradual improvements.  She is on the CPM machine about 3 hours a day currently at 60 degrees.  On examination her shoulder has pretty reasonable range of motion.  Incisions intact sutures removed.  Plan is to discontinue sling next weekend but no lifting with the arm or biceps at that time.  Continue the CPM machine for 3 hours a day.  2-week return and we will likely start physical therapy closer to her home at that time.  Follow-Up Instructions: Return in about 2 weeks (around 01/26/2022).   Orders:  No orders of the defined types were placed in this encounter.  No orders of the defined types were placed in this encounter.   Imaging: No results found.  PMFS History: There are no problems to display for this patient.  Past Medical History:  Diagnosis Date   Headache    pt has migraines (she states maybe about 10 times a month)   Hypertension    PONV (postoperative nausea and vomiting)     Family History  Problem Relation Age of Onset   Breast cancer Neg Hx     Past Surgical History:  Procedure Laterality Date   ABLATION  2008   endometrial   CESAREAN SECTION  2007   SHOULDER ARTHROSCOPY WITH CAPSULORRHAPHY Right 01/05/2022   Procedure: right shoulder arthroscopy, biceps tenodesis, open capsular repair;  Surgeon: Cammy Copa, MD;  Location: [facility] OR;  Service: Orthopedics;  Laterality: Right;   Social History   Occupational  History   Not on file  Tobacco Use   Smoking status: Never   Smokeless tobacco: Never  Vaping Use   Vaping Use: Never used  Substance and Sexual Activity   Alcohol use: Not Currently   Drug use: Never   Sexual activity: Not on file

## 2022-01-13 DIAGNOSIS — M75111 Incomplete rotator cuff tear or rupture of right shoulder, not specified as traumatic: Secondary | ICD-10-CM

## 2022-01-13 DIAGNOSIS — M7521 Bicipital tendinitis, right shoulder: Secondary | ICD-10-CM

## 2022-01-27 ENCOUNTER — Ambulatory Visit (INDEPENDENT_AMBULATORY_CARE_PROVIDER_SITE_OTHER): Payer: Managed Care, Other (non HMO) | Admitting: Orthopedic Surgery

## 2022-01-27 DIAGNOSIS — M75111 Incomplete rotator cuff tear or rupture of right shoulder, not specified as traumatic: Secondary | ICD-10-CM

## 2022-01-31 ENCOUNTER — Other Ambulatory Visit: Payer: Self-pay | Admitting: Orthopedic Surgery

## 2022-01-31 MED ORDER — OXYCODONE HCL 5 MG PO TABS: 5.0000 mg | ORAL_TABLET | Freq: Three times a day (TID) | ORAL | 0 refills | Status: AC | PRN

## 2022-02-11 ENCOUNTER — Encounter: Payer: Self-pay | Admitting: Orthopedic Surgery

## 2022-02-24 ENCOUNTER — Ambulatory Visit (INDEPENDENT_AMBULATORY_CARE_PROVIDER_SITE_OTHER): Payer: Managed Care, Other (non HMO) | Admitting: Orthopedic Surgery

## 2022-02-24 DIAGNOSIS — M75111 Incomplete rotator cuff tear or rupture of right shoulder, not specified as traumatic: Secondary | ICD-10-CM

## 2022-02-25 ENCOUNTER — Encounter: Payer: Self-pay | Admitting: Orthopedic Surgery

## 2022-02-25 NOTE — Progress Notes (Signed)
   Post-Op Visit Note   Patient: [name] [name]           Date of Birth: Jan 12, 1976           MRN: [id number] Visit Date: 02/24/2022 PCP: Milus Height, PA   Assessment & Plan:  Chief Complaint:  Chief Complaint  Patient presents with   Right Shoulder - Routine Post Op     01/05/22 (7w 1d) right shoulder arthroscopy, biceps tenodesis, open capsular repair -   Visit Diagnoses:  1. Incomplete tear of right rotator cuff, unspecified whether traumatic     Plan: [name] [name] is a 46 year old patient underwent right shoulder arthroscopy biceps tenodesis and mini open rotator cuff tear repair 01/05/2022.  Doing home exercise program and physical therapy.  She has returned to work.  Trying not to lift her arm very much.  Sleeping okay but it does hurt more at night.  On examination she has a little bit of crepitus with passive range of motion today but not out of the realm of what would expect from postop scarring.  Rotator cuff strength looks good.  Plan at this time is okay for rotator cuff strengthening but I want to hold off on bicep strengthening for 2 weeks.  Follow-up in 6 weeks for clinical recheck.  Overall [name] [name] is becoming more functional with the arm but I do want her to work more on her range of motion as the strength will likely come on its own.  Follow-Up Instructions: Return in about 6 weeks (around 04/07/2022).   Orders:  No orders of the defined types were placed in this encounter.  No orders of the defined types were placed in this encounter.   Imaging: No results found.  PMFS History: Patient Active Problem List   Diagnosis Date Noted   Incomplete tear of right rotator cuff    Biceps tendonitis on right    Past Medical History:  Diagnosis Date   Headache    pt has migraines (she states maybe about 10 times a month)   Hypertension    PONV (postoperative nausea and vomiting)     Family History  Problem Relation Age of Onset   Breast cancer Neg Hx     Past  Surgical History:  Procedure Laterality Date   ABLATION  2008   endometrial   CESAREAN SECTION  2007   SHOULDER ARTHROSCOPY WITH CAPSULORRHAPHY Right 01/05/2022   Procedure: right shoulder arthroscopy, biceps tenodesis, open capsular repair;  Surgeon: Cammy Copa, MD;  Location: [facility] OR;  Service: Orthopedics;  Laterality: Right;   Social History   Occupational History   Not on file  Tobacco Use   Smoking status: Never   Smokeless tobacco: Never  Vaping Use   Vaping Use: Never used  Substance and Sexual Activity   Alcohol use: Not Currently   Drug use: Never   Sexual activity: Not on file

## 2022-04-07 ENCOUNTER — Ambulatory Visit (INDEPENDENT_AMBULATORY_CARE_PROVIDER_SITE_OTHER): Payer: Managed Care, Other (non HMO) | Admitting: Orthopedic Surgery

## 2022-04-07 DIAGNOSIS — M75111 Incomplete rotator cuff tear or rupture of right shoulder, not specified as traumatic: Secondary | ICD-10-CM

## 2022-04-08 ENCOUNTER — Encounter: Payer: Self-pay | Admitting: Orthopedic Surgery

## 2022-04-08 NOTE — Progress Notes (Signed)
   Post-Op Visit Note   Patient: [name] [name]           Date of Birth: [birth year]           MRN: [id number] Visit Date: 04/07/2022 PCP: Milus Height, PA   Assessment & Plan:  Chief Complaint:  Chief Complaint  Patient presents with   Right Shoulder - Routine Post Op     01/05/22 (74m) right shoulder arthroscopy, biceps tenodesis, open capsular repair      Visit Diagnoses:  1. Incomplete tear of right rotator cuff, unspecified whether traumatic     Plan: [name] [name] is a 46 year old patient who underwent right shoulder biceps tenodesis and rotator cuff repair 01/05/2022.  3 months out.  Had dry needling Wednesday.  Neck and shoulder.  That helped her with some range of motion.  Doing 6 pounds resistance for biceps 40 pounds for triceps.  Has pulley at home.  Also has a golf club at home.  She is being diligent about her home exercise program.  On examination her range of motion is 45/80/140.  Cuff strength feels good.  Plan at this time is to continue with home exercise program to really focus more on stretching than anything else.  Still is a little bit on the stiff side.  6-week return with decision for or against ultrasound-guided intra-articular injection at that time to try to facilitate return of motion.  Follow-Up Instructions: Return in about 6 weeks (around 05/19/2022).   Orders:  No orders of the defined types were placed in this encounter.  No orders of the defined types were placed in this encounter.   Imaging: No results found.  PMFS History: Patient Active Problem List   Diagnosis Date Noted   Incomplete tear of right rotator cuff    Biceps tendonitis on right    Past Medical History:  Diagnosis Date   Headache    pt has migraines (she states maybe about 10 times a month)   Hypertension    PONV (postoperative nausea and vomiting)     Family History  Problem Relation Age of Onset   Breast cancer Neg Hx     Past Surgical History:  Procedure Laterality Date    ABLATION  2008   endometrial   CESAREAN SECTION  2007   SHOULDER ARTHROSCOPY WITH CAPSULORRHAPHY Right 01/05/2022   Procedure: right shoulder arthroscopy, biceps tenodesis, open capsular repair;  Surgeon: Cammy Copa, MD;  Location: [facility] OR;  Service: Orthopedics;  Laterality: Right;   Social History   Occupational History   Not on file  Tobacco Use   Smoking status: Never   Smokeless tobacco: Never  Vaping Use   Vaping Use: Never used  Substance and Sexual Activity   Alcohol use: Not Currently   Drug use: Never   Sexual activity: Not on file

## 2022-05-19 ENCOUNTER — Ambulatory Visit: Payer: Self-pay

## 2022-05-19 ENCOUNTER — Ambulatory Visit (INDEPENDENT_AMBULATORY_CARE_PROVIDER_SITE_OTHER): Payer: Managed Care, Other (non HMO) | Admitting: Orthopedic Surgery

## 2022-05-19 DIAGNOSIS — M75111 Incomplete rotator cuff tear or rupture of right shoulder, not specified as traumatic: Secondary | ICD-10-CM

## 2022-05-20 ENCOUNTER — Encounter: Payer: Self-pay | Admitting: Orthopedic Surgery

## 2022-05-20 MED ORDER — LIDOCAINE HCL 1 % IJ SOLN
5.0000 mL | INTRAMUSCULAR | Status: AC | PRN
  Administered 2022-05-19: 5 mL

## 2022-05-20 MED ORDER — BUPIVACAINE HCL 0.5 % IJ SOLN
9.0000 mL | INTRAMUSCULAR | Status: AC | PRN
  Administered 2022-05-19: 9 mL via INTRA_ARTICULAR

## 2022-05-20 MED ORDER — METHYLPREDNISOLONE ACETATE 40 MG/ML IJ SUSP
40.0000 mg | INTRAMUSCULAR | Status: AC | PRN
  Administered 2022-05-19: 40 mg via INTRA_ARTICULAR

## 2022-05-20 NOTE — Progress Notes (Signed)
Office Visit Note   Patient: [name] [name]           Date of Birth: 07/04/1976           MRN: [id number] Visit Date: 05/19/2022 Requested by: Milus Height, PA [address] [place],  [place] [address] PCP: Milus Height, [place]  Subjective: Chief Complaint  Patient presents with   Right Shoulder - Follow-up    HPI: [name] [name] is a 46 y.o. female who presents to the office now about 4 months out right shoulder arthroscopy biceps tenodesis and mini open rotator cuff tear repair.  She is doing dry needling and physical therapy.  Reports continued tightness in the right shoulder to a point where her shoulder is becoming less functional.  Decision point today was for or against glenohumeral injection.  Last exam range of motion was 45/80/140              ROS: All systems reviewed are negative as they relate to the chief complaint within the history of present illness.  Patient denies fevers or chills.  Assessment & Plan: Visit Diagnoses:  1. 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  Glenohumeral joint injection performed today under ultrasound examination and guidance.  6-week return with decision for or against manipulation under anesthesia at that time.  Follow-Up Instructions: No follow-ups on file.   Orders:  Orders Placed This Encounter  Procedures   US Guided Needle Placement - No Linked Charges   No orders of the defined types were placed in this encounter.     Procedures: Large Joint Inj: R glenohumeral on 05/19/2022 10:23 PM Indications: diagnostic evaluation and pain Details: 18 G 1.5 in needle, posterior approach  Arthrogram: No  Medications: 9 mL bupivacaine 0.5 %; 40 mg methylPREDNISolone acetate 40 MG/ML; 5 mL lidocaine 1 % Outcome: tolerated well, no immediate complications Procedure, treatment alternatives, risks and benefits  explained, specific risks discussed. Consent was given by the patient. Immediately prior to procedure a time out was called to verify the correct patient, procedure, equipment, support staff and site/side marked as required. Patient was prepped and draped in the usual sterile fashion.       Clinical Data: No additional findings.  Objective: Vital Signs: There were no vitals taken for this visit.  Physical Exam:  Constitutional: Patient appears well-developed HEENT:  Head: Normocephalic Eyes:EOM are normal Neck: Normal range of motion Cardiovascular: Normal rate Pulmonary/chest: Effort normal Neurologic: Patient is alert Skin: Skin is warm Psychiatric: Patient has normal mood and affect  Ortho Exam: Ortho exam demonstrates excellent rotator cuff strength.  No coarse grinding or crepitus with internal/external Tatian of the shoulder.  No masses lymphadenopathy or skin changes noted in that shoulder region.  Her range of motion was 35/70/110 which is less than she was 6 weeks ago.  Specialty Comments:  No specialty comments available.  Imaging: No results found.   PMFS History: Patient Active Problem List   Diagnosis Date Noted   Incomplete tear of right rotator cuff    Biceps tendonitis on right    Past Medical History:  Diagnosis Date   Headache    pt has migraines (she states maybe about 10 times a month)   Hypertension    PONV (postoperative nausea and vomiting)     Family History  Problem Relation Age of Onset   Breast cancer Neg  Hx     Past Surgical History:  Procedure Laterality Date   ABLATION  2008   endometrial   CESAREAN SECTION  2007   SHOULDER ARTHROSCOPY WITH CAPSULORRHAPHY Right 01/05/2022   Procedure: right shoulder arthroscopy, biceps tenodesis, open capsular repair;  Surgeon: Meredith Pel, MD;  Location: [facility];  Service: Orthopedics;  Laterality: Right;   Social History   Occupational History   Not on file  Tobacco Use   Smoking  status: Never   Smokeless tobacco: Never  Vaping Use   Vaping Use: Never used  Substance and Sexual Activity   Alcohol use: Not Currently   Drug use: Never   Sexual activity: Not on file

## 2022-05-23 ENCOUNTER — Other Ambulatory Visit: Payer: Self-pay

## 2022-05-23 MED ORDER — TIZANIDINE HCL 4 MG PO TABS: ORAL_TABLET | ORAL | 0 refills | Status: AC

## 2022-06-30 ENCOUNTER — Ambulatory Visit: Payer: Managed Care, Other (non HMO) | Admitting: Orthopedic Surgery

## 2022-07-27 ENCOUNTER — Other Ambulatory Visit: Payer: Self-pay | Admitting: *Deleted

## 2022-07-27 DIAGNOSIS — Z1239 Encounter for other screening for malignant neoplasm of breast: Secondary | ICD-10-CM

## 2022-11-16 ENCOUNTER — Other Ambulatory Visit: Payer: Self-pay | Admitting: Physician Assistant

## 2022-11-16 DIAGNOSIS — Z1231 Encounter for screening mammogram for malignant neoplasm of breast: Secondary | ICD-10-CM

## 2022-11-17 ENCOUNTER — Ambulatory Visit
Admission: RE | Admit: 2022-11-17 | Discharge: 2022-11-17 | Disposition: A | Discharge status: Home / Self Care | Payer: Managed Care, Other (non HMO) | Source: Ambulatory Visit | Attending: Physician Assistant | Admitting: Physician Assistant

## 2022-11-17 DIAGNOSIS — Z1231 Encounter for screening mammogram for malignant neoplasm of breast: Secondary | ICD-10-CM

## 2023-06-14 ENCOUNTER — Other Ambulatory Visit: Payer: Self-pay | Admitting: Physician Assistant

## 2023-06-14 DIAGNOSIS — J329 Chronic sinusitis, unspecified: Secondary | ICD-10-CM

## 2023-07-04 ENCOUNTER — Ambulatory Visit
Admission: RE | Admit: 2023-07-04 | Discharge: 2023-07-04 | Disposition: A | Discharge status: Home / Self Care | Payer: Managed Care, Other (non HMO) | Source: Ambulatory Visit | Attending: Physician Assistant | Admitting: Physician Assistant

## 2023-07-04 DIAGNOSIS — J329 Chronic sinusitis, unspecified: Secondary | ICD-10-CM

## 2023-07-12 IMAGING — MG MM DIGITAL SCREENING BILAT W/ TOMO AND CAD
8 series · 8 of 24 positions shown · non-contrast
Comparison: Previous exam(s).

CLINICAL DATA: Screening.

EXAM:
DIGITAL SCREENING BILATERAL MAMMOGRAM WITH TOMOSYNTHESIS AND CAD
TECHNIQUE: Bilateral screening digital craniocaudal and mediolateral oblique
mammograms were obtained. Bilateral screening digital breast
tomosynthesis was performed. The images were evaluated with
computer-aided detection.

[R MLO synth-2D]
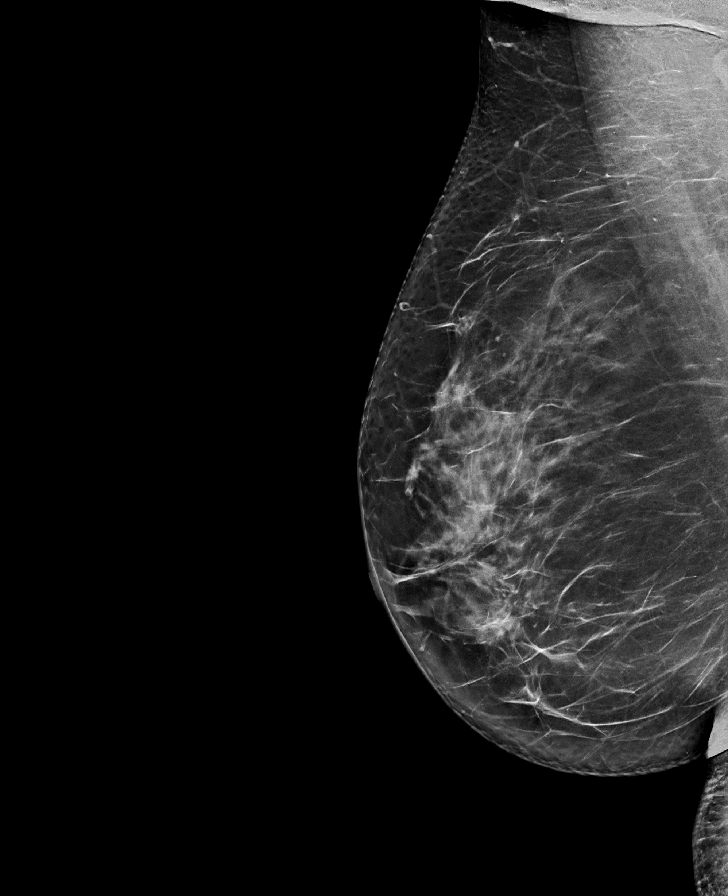

[L CC synth-2D]
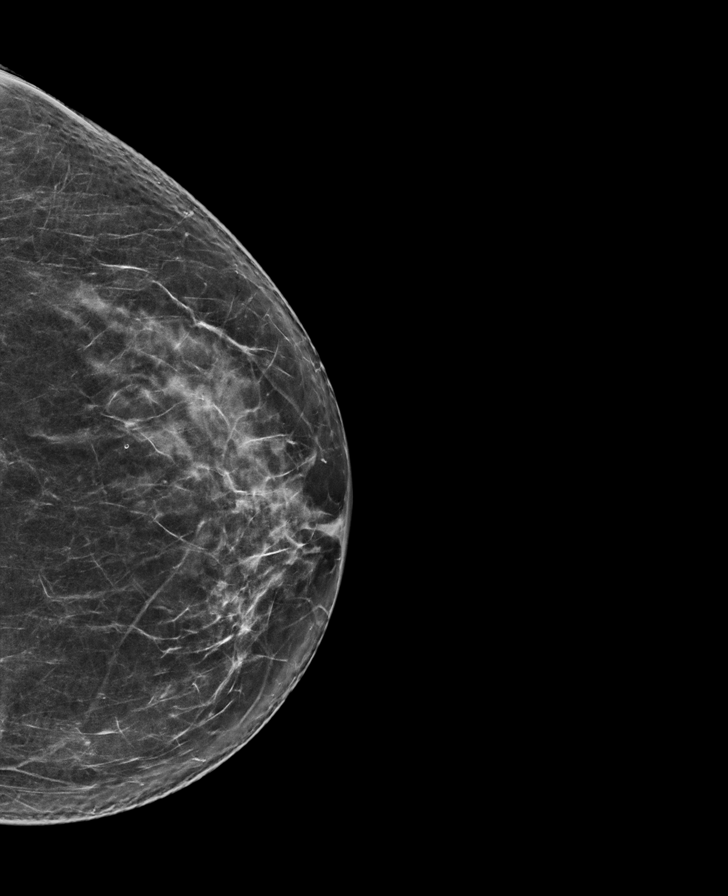

[L MLO synth-2D]
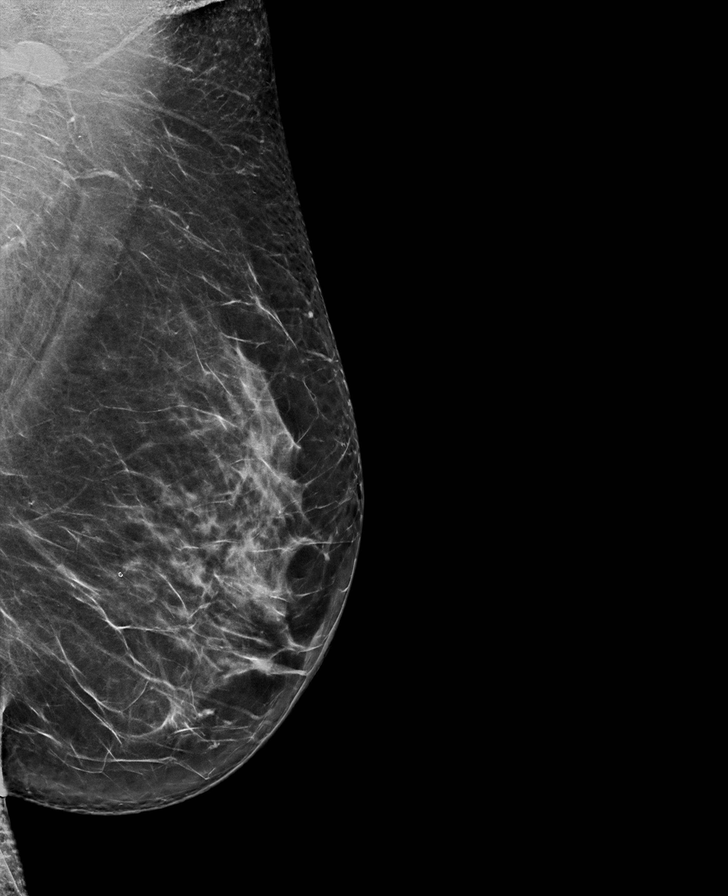

[R CC synth-2D]
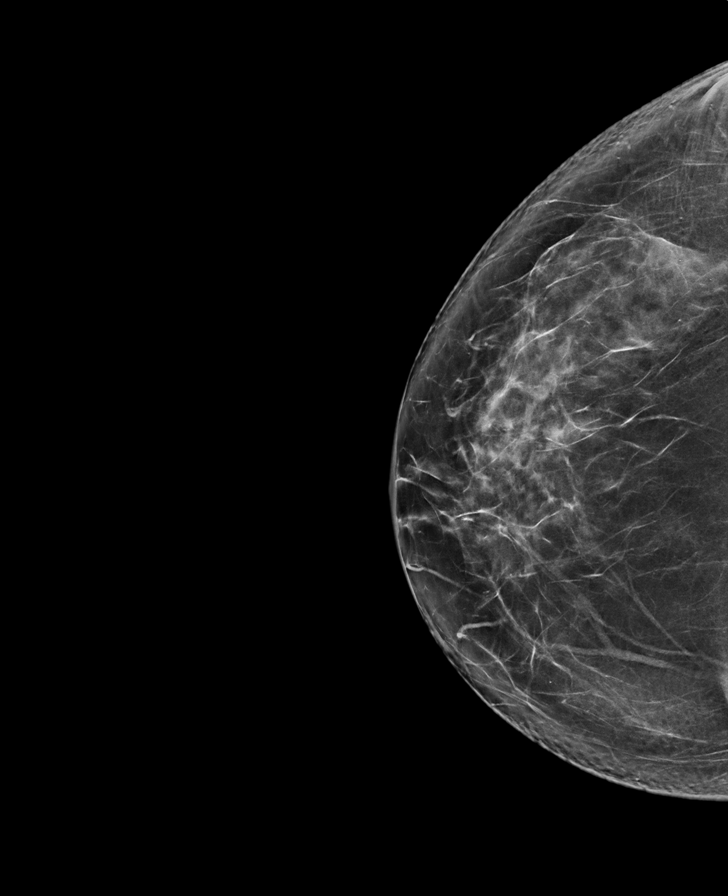

[L CC tomo · tomo slice 40/79.0]
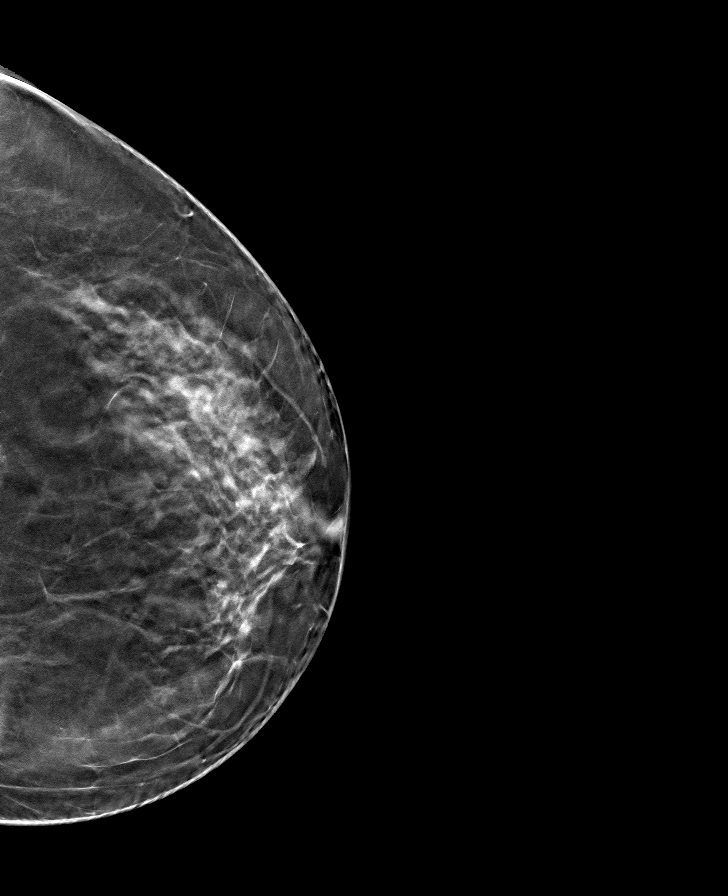

[R MLO tomo · tomo slice 43/86.0]
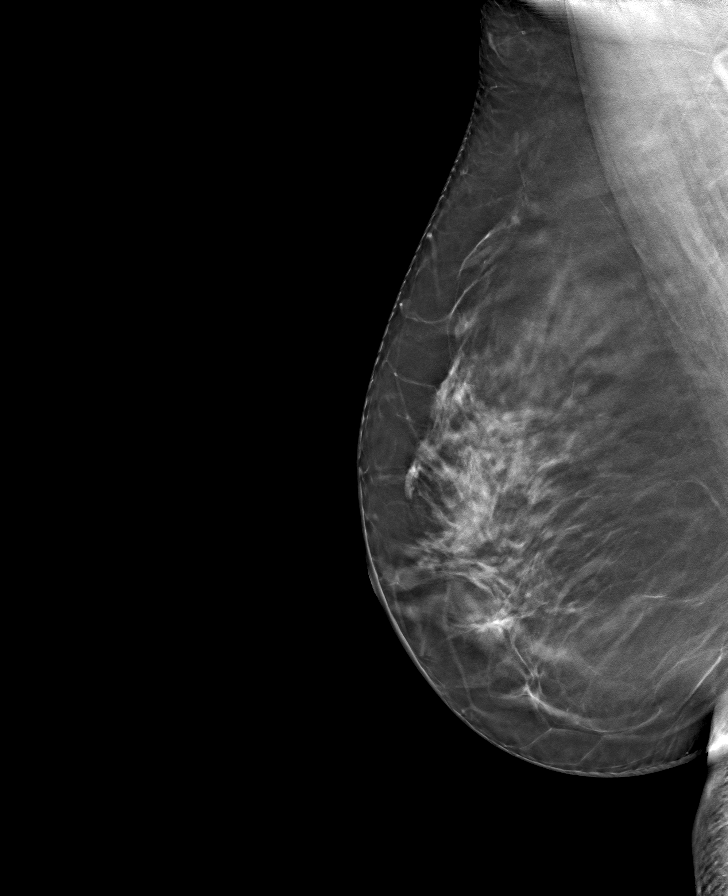

[R CC tomo · tomo slice 44/87.0]
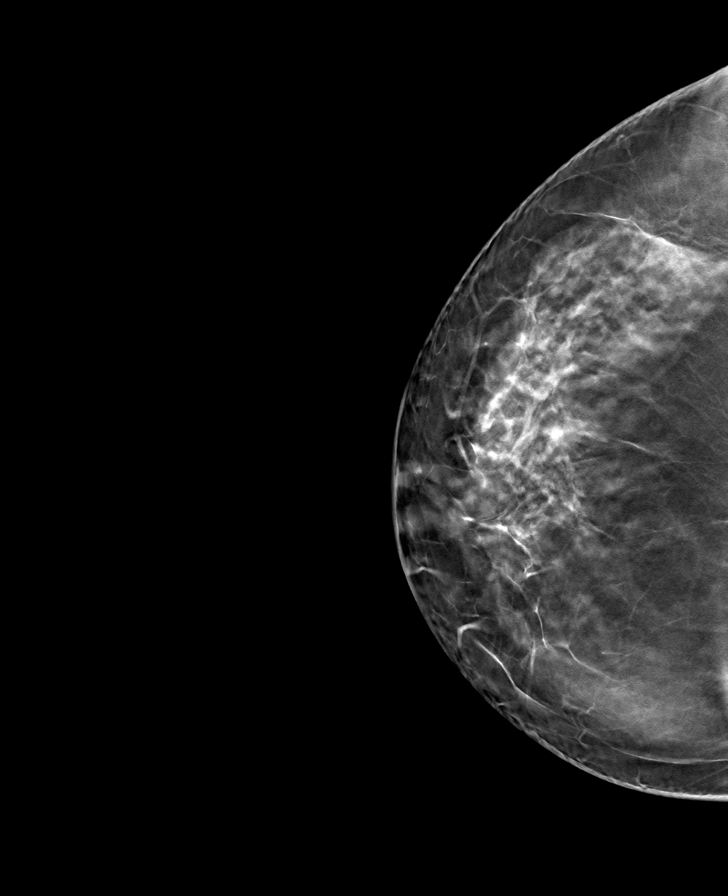

[L MLO tomo · tomo slice 43/85.0]
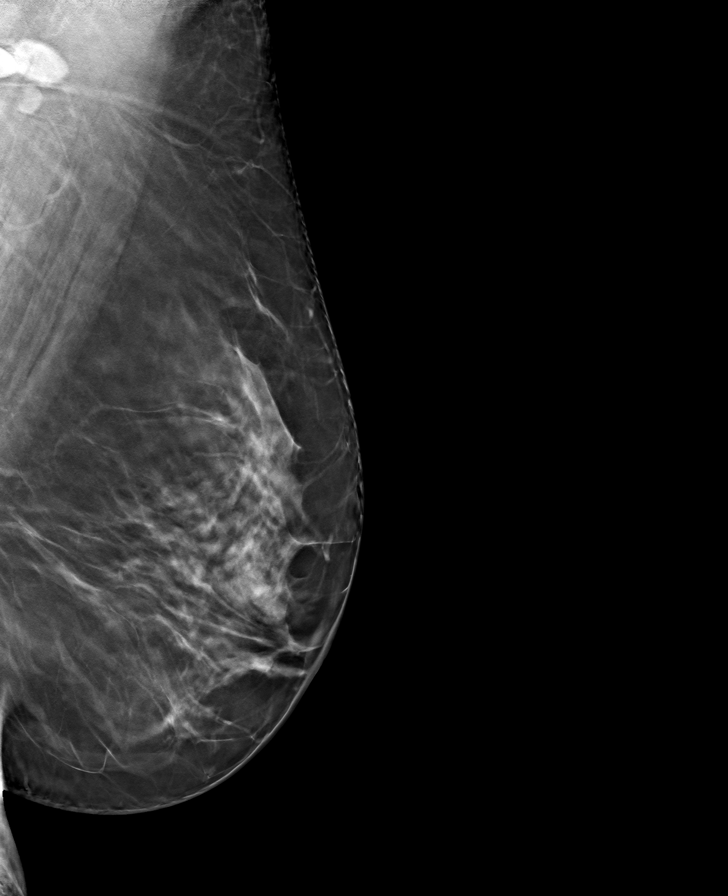

[8 of 24 positions shown; findings below may reference images not displayed]

ACR Breast Density Category c: The breast tissue is heterogeneously
dense, which may obscure small masses.
FINDINGS: There are no findings suspicious for malignancy.
IMPRESSION: No mammographic evidence of malignancy. A result letter of this
screening mammogram will be mailed directly to the patient.

RECOMMENDATION:
Screening mammogram in one year. (Code:[id number])

BI-RADS CATEGORY  1: Negative.

## 2023-08-03 ENCOUNTER — Ambulatory Visit (HOSPITAL_COMMUNITY): Payer: Self-pay

## 2023-08-07 ENCOUNTER — Other Ambulatory Visit: Payer: Self-pay | Admitting: *Deleted

## 2023-08-07 ENCOUNTER — Ambulatory Visit
Admission: RE | Admit: 2023-08-07 | Discharge: 2023-08-07 | Disposition: A | Discharge status: Home / Self Care | Payer: Managed Care, Other (non HMO) | Source: Ambulatory Visit | Attending: Family Medicine | Admitting: Family Medicine

## 2023-08-07 DIAGNOSIS — R051 Acute cough: Secondary | ICD-10-CM

## 2023-08-09 ENCOUNTER — Other Ambulatory Visit (HOSPITAL_COMMUNITY): Payer: Self-pay | Admitting: Pulmonary Disease

## 2023-08-09 DIAGNOSIS — R051 Acute cough: Secondary | ICD-10-CM

## 2023-10-16 ENCOUNTER — Other Ambulatory Visit: Payer: Self-pay | Admitting: Interventional Radiology

## 2023-10-16 ENCOUNTER — Telehealth: Payer: Managed Care, Other (non HMO)

## 2023-10-16 DIAGNOSIS — D259 Leiomyoma of uterus, unspecified: Secondary | ICD-10-CM

## 2023-12-03 ENCOUNTER — Other Ambulatory Visit: Payer: Self-pay | Admitting: Physician Assistant

## 2023-12-03 ENCOUNTER — Ambulatory Visit
Admission: RE | Admit: 2023-12-03 | Discharge: 2023-12-03 | Disposition: A | Discharge status: Home / Self Care | Source: Ambulatory Visit | Attending: Physician Assistant | Admitting: Physician Assistant

## 2023-12-03 DIAGNOSIS — Z1231 Encounter for screening mammogram for malignant neoplasm of breast: Secondary | ICD-10-CM

## 2024-05-29 ENCOUNTER — Other Ambulatory Visit (HOSPITAL_BASED_OUTPATIENT_CLINIC_OR_DEPARTMENT_OTHER): Payer: Self-pay

## 2024-05-29 ENCOUNTER — Ambulatory Visit (INDEPENDENT_AMBULATORY_CARE_PROVIDER_SITE_OTHER)

## 2024-05-29 ENCOUNTER — Ambulatory Visit (INDEPENDENT_AMBULATORY_CARE_PROVIDER_SITE_OTHER): Admitting: Student

## 2024-05-29 DIAGNOSIS — M25522 Pain in left elbow: Secondary | ICD-10-CM | POA: Diagnosis not present

## 2024-05-29 MED ORDER — MELOXICAM 15 MG PO TABS
15.0000 mg | ORAL_TABLET | Freq: Every day | ORAL | 0 refills | Status: AC
  Filled 2024-05-29: qty 10, 10d supply, fill #0

## 2024-05-29 NOTE — Progress Notes (Signed)
 Chief Complaint: Right elbow pain    Discussed the use of AI scribe software for clinical note transcription with the patient, who gave verbal consent to proceed.  History of Present Illness [name] [name] is a 48 year old female who presents with right elbow pain. She experiences significant pain at the tip of her right elbow, which has been present for a couple of days. The pain is severe, hypersensitive, and radiates down her arm into her hand and fingers. It worsens with contact, requiring careful positioning to avoid exacerbation. Ice and a compression sleeve provide temporary relief, but the pain persists and is worsening. There is no specific injury to the elbow, and the pain began spontaneously. She is right-handed and has not noticed any associated shoulder or neck pain. Her past medical history includes rotator cuff surgery on the contralateral side. She occasionally uses ibuprofen for pain relief. The pain radiates through her entire hand without significant swelling or redness at the elbow.    Surgical History:   None of right elbow  PMH/PSH/Family History/Social History/Meds/Allergies:    Past Medical History:  Diagnosis Date   Headache    pt has migraines (she states maybe about 10 times a month)   Hypertension    PONV (postoperative nausea and vomiting)    Past Surgical History:  Procedure Laterality Date   ABLATION  2008   endometrial   CESAREAN SECTION  2007   SHOULDER ARTHROSCOPY WITH CAPSULORRHAPHY Right 01/05/2022   Procedure: right shoulder arthroscopy, biceps tenodesis, open capsular repair;  Surgeon: Addie Cordella Hamilton, MD;  Location: [facility];  Service: Orthopedics;  Laterality: Right;   Social History   Socioeconomic History   Marital status: Married    Spouse name: Not on file   Number of children: 1   Years of education: Not on file   Highest education level: Not on file  Occupational History   Not on file  Tobacco Use    Smoking status: Never   Smokeless tobacco: Never  Vaping Use   Vaping status: Never Used  Substance and Sexual Activity   Alcohol use: Not Currently   Drug use: Never   Sexual activity: Not on file  Other Topics Concern   Not on file  Social History Narrative   Not on file   Social Drivers of Health   Financial Resource Strain: Low Risk  (05/09/2024)   Received [facility]   Overall Financial Resource Strain (CARDIA)    How hard is it for you to pay for the very basics like food, housing, medical care, and heating?: Not hard at all  Food Insecurity: No Food Insecurity (05/09/2024)   Received from [facility]   Hunger Vital Sign    Within the past 12 months, you worried that your food would run out before you got the money to buy more.: Never true    Within the past 12 months, the food you bought just didn't last and you didn't have money to get more.: Never true  Transportation Needs: No Transportation Needs (05/09/2024)   Received from [facility] - Transportation    In the past 12 months, has lack of transportation kept you from medical appointments or from getting medications?: No    In the past 12 months, has lack of transportation  kept you from meetings, work, or from getting things needed for daily living?: No  Physical Activity: Insufficiently Active (05/09/2024)   Received from [facility]   Exercise Vital Sign    On average, how many days per week do you engage in moderate to strenuous exercise (like a brisk walk)?: 3 days    On average, how many minutes do you engage in exercise at this level?: 30 min  Stress: No Stress Concern Present (05/09/2024)   Received from [facility] - Occupational Stress Questionnaire    Do you feel stress - tense, restless, nervous, or anxious, or unable to sleep at night because your mind is troubled all the time - these days?: Not at all  Social Connections: Moderately  Integrated (05/09/2024)   Received from [facility]   Social Network    How would you rate your social network (family, work, friends)?: Adequate participation with social networks   Family History  Problem Relation Age of Onset   Breast cancer Neg Hx    No Known Allergies Current Outpatient Medications  Medication Sig Dispense Refill   meloxicam (MOBIC) 15 MG tablet Take 1 tablet (15 mg total) by mouth daily for 10 days. 10 tablet 0   ondansetron  (ZOFRAN ) 4 MG tablet Take 1 tablet (4 mg total) by mouth every 8 (eight) hours as needed for nausea or vomiting. 20 tablet 0   amLODipine (NORVASC) 5 MG tablet Take 5 mg by mouth daily.     hydrochlorothiazide (HYDRODIURIL) 25 MG tablet Take 12.5 mg by mouth daily.     methocarbamol  (ROBAXIN ) 500 MG tablet Take 1 tablet (500 mg total) by mouth every 8 (eight) hours as needed for muscle spasms. 30 tablet 1   Multiple Vitamins-Minerals (MULTIVITAMIN WITH MINERALS) tablet Take 1 tablet by mouth daily.     oxyCODONE  (OXY IR/ROXICODONE ) 5 MG immediate release tablet Take 1 tablet (5 mg total) by mouth every 8 (eight) hours as needed for severe pain. 30 tablet 0   tiZANidine  (ZANAFLEX ) 4 MG tablet 1 tab every 8 to 12 hours as needed 30 tablet 0   valsartan (DIOVAN) 160 MG tablet Take 160 mg by mouth daily.     No current facility-administered medications for this visit.   No results found.  Review of Systems:   A ROS was performed including pertinent positives and negatives as documented in the HPI.  Physical Exam :   Constitutional: NAD and appears stated age Neurological: Alert and oriented Psych: Appropriate affect and cooperative There were no vitals taken for this visit.   Comprehensive Musculoskeletal Exam:    Exam of the left elbow demonstrates no obvious abnormality or deformity.  Full active range of motion from 0 to 130 degrees.  Tenderness more localized to the olecranon process without tenderness over the epicondyles or ulnar  groove.  Negative Tinel's test at the elbow.  Grip strength 5/5 bilaterally.  Imaging:   Xray (right elbow 3 views): Negative for bony abnormality   I personally reviewed and interpreted the radiographs.      Assessment & Plan Acute left elbow pain with possible nerve involvement   Patient is experiencing acute atraumatic left elbow pain.  Her symptoms appear more nerve related in nature due to radiating symptoms down into the hand as well as tingling and hypersensitivity.  X-rays today show no bony abnormalities.  This would suggest possible cubital tunnel syndrome although not able to replicate symptoms with Tinel's test at  the ulnar groove and she reports that the symptoms travel into all of the fingers.  Currently no discomfort in the neck or shoulder area.  Will plan to start meloxicam 15 mg for 10 days to see if this helps resolve symptoms.  Consider nerve conduction study if symptoms persist or worsen.       I personally saw and evaluated the patient, and participated in the management and treatment plan.  Leonce Reveal, PA-C Orthopedics

## 2024-06-16 ENCOUNTER — Encounter (HOSPITAL_BASED_OUTPATIENT_CLINIC_OR_DEPARTMENT_OTHER): Payer: Self-pay

## 2024-06-16 ENCOUNTER — Other Ambulatory Visit (HOSPITAL_BASED_OUTPATIENT_CLINIC_OR_DEPARTMENT_OTHER): Payer: Self-pay | Admitting: Student

## 2024-06-16 MED ORDER — MELOXICAM 15 MG PO TABS: 15.0000 mg | ORAL_TABLET | Freq: Every day | ORAL | 0 refills | Status: AC

## 2024-06-23 ENCOUNTER — Encounter: Payer: Self-pay | Admitting: Radiology

## 2024-07-22 ENCOUNTER — Encounter (HOSPITAL_BASED_OUTPATIENT_CLINIC_OR_DEPARTMENT_OTHER): Payer: Self-pay

## 2024-07-22 ENCOUNTER — Other Ambulatory Visit (HOSPITAL_BASED_OUTPATIENT_CLINIC_OR_DEPARTMENT_OTHER): Payer: Self-pay | Admitting: Student

## 2024-07-22 DIAGNOSIS — M25522 Pain in left elbow: Secondary | ICD-10-CM

## 2024-07-22 MED ORDER — MELOXICAM 15 MG PO TABS: 15.0000 mg | ORAL_TABLET | Freq: Every day | ORAL | 0 refills | Status: AC

## 2024-08-26 ENCOUNTER — Encounter: Admitting: Physical Medicine and Rehabilitation
